# Patient Record
Sex: Male | Born: 1975
Health system: Southern US, Community
[De-identification: ages and names within clinical notes are randomized; demographics above are authoritative.]

## PROBLEM LIST (undated history)

## (undated) DIAGNOSIS — F4481 Dissociative identity disorder: Secondary | ICD-10-CM

## (undated) DIAGNOSIS — T4145XA Adverse effect of unspecified anesthetic, initial encounter: Secondary | ICD-10-CM

## (undated) DIAGNOSIS — T8859XA Other complications of anesthesia, initial encounter: Secondary | ICD-10-CM

## (undated) DIAGNOSIS — F4310 Post-traumatic stress disorder, unspecified: Secondary | ICD-10-CM

## (undated) DIAGNOSIS — E119 Type 2 diabetes mellitus without complications: Secondary | ICD-10-CM

## (undated) DIAGNOSIS — F6381 Intermittent explosive disorder: Secondary | ICD-10-CM

## (undated) DIAGNOSIS — I10 Essential (primary) hypertension: Secondary | ICD-10-CM

## (undated) DIAGNOSIS — F319 Bipolar disorder, unspecified: Secondary | ICD-10-CM

## (undated) DIAGNOSIS — G8929 Other chronic pain: Secondary | ICD-10-CM

## (undated) HISTORY — PX: FRACTURE SURGERY: SHX138

---
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
## 2009-11-19 ENCOUNTER — Emergency Department (HOSPITAL_COMMUNITY): Admission: EM | Admit: 2009-11-19 | Discharge: 2009-11-19 | Payer: Self-pay | Admitting: Emergency Medicine

## 2009-11-24 ENCOUNTER — Emergency Department (HOSPITAL_COMMUNITY): Admission: EM | Admit: 2009-11-24 | Discharge: 2009-11-24 | Payer: Self-pay | Admitting: Emergency Medicine

## 2010-04-30 ENCOUNTER — Emergency Department (HOSPITAL_COMMUNITY): Admission: EM | Admit: 2010-04-30 | Discharge: 2010-04-30 | Payer: Self-pay | Admitting: Emergency Medicine

## 2019-03-14 ENCOUNTER — Other Ambulatory Visit: Payer: Self-pay

## 2019-03-14 ENCOUNTER — Encounter (HOSPITAL_COMMUNITY): Payer: Self-pay | Admitting: Emergency Medicine

## 2019-03-14 ENCOUNTER — Emergency Department (HOSPITAL_COMMUNITY)
Admission: EM | Admit: 2019-03-14 | Discharge: 2019-03-14 | Discharge status: Against Medical Advice | Payer: Medicaid Other | Attending: Emergency Medicine | Admitting: Emergency Medicine

## 2019-03-14 DIAGNOSIS — M7918 Myalgia, other site: Secondary | ICD-10-CM | POA: Diagnosis present

## 2019-03-14 DIAGNOSIS — Z5321 Procedure and treatment not carried out due to patient leaving prior to being seen by health care provider: Secondary | ICD-10-CM | POA: Diagnosis not present

## 2019-03-14 HISTORY — DX: Essential (primary) hypertension: I10

## 2019-03-14 NOTE — ED Triage Notes (Signed)
Pt c/o generalized body pains for over a month. Denies injuries that could have caused this. Reports hurts to sit, lay down or to move.

## 2019-03-23 ENCOUNTER — Ambulatory Visit (HOSPITAL_COMMUNITY)
Admission: EM | Admit: 2019-03-23 | Discharge: 2019-03-23 | Disposition: A | Discharge status: Home / Self Care | Payer: Medicaid Other | Attending: Family Medicine | Admitting: Family Medicine

## 2019-03-23 ENCOUNTER — Encounter (HOSPITAL_COMMUNITY): Payer: Self-pay

## 2019-03-23 ENCOUNTER — Other Ambulatory Visit: Payer: Self-pay

## 2019-03-23 DIAGNOSIS — M5442 Lumbago with sciatica, left side: Secondary | ICD-10-CM

## 2019-03-23 DIAGNOSIS — M5441 Lumbago with sciatica, right side: Secondary | ICD-10-CM

## 2019-03-23 MED ORDER — CYCLOBENZAPRINE HCL 5 MG PO TABS: 5.0000 mg | ORAL_TABLET | Freq: Two times a day (BID) | ORAL | 0 refills | Status: DC | PRN

## 2019-03-23 MED ORDER — KETOROLAC TROMETHAMINE 30 MG/ML IJ SOLN
INTRAMUSCULAR | Status: AC
  Filled 2019-03-23: qty 1

## 2019-03-23 MED ORDER — PREDNISONE 50 MG PO TABS: 50.0000 mg | ORAL_TABLET | Freq: Every day | ORAL | 0 refills | Status: AC

## 2019-03-23 MED ORDER — KETOROLAC TROMETHAMINE 30 MG/ML IJ SOLN
30.0000 mg | Freq: Once | INTRAMUSCULAR | Status: AC
  Administered 2019-03-23: 30 mg via INTRAMUSCULAR

## 2019-03-23 NOTE — ED Provider Notes (Signed)
[place]    CSN: [id number] Arrival date & time: 03/23/19  1709      History   Chief Complaint Chief Complaint  Patient presents with   Back Pain   Leg Pain    HPI Mario Little is a 43 y.o. male history of obesity, hypertension presenting today for evaluation of lower back pain and groin pain.  Patient states that over the past 1 to 2 weeks he has had pain in his lower back that wraps around to his lower abdomen/groin.  He feels a knot that is located in his lower back.  States that typically when he has flares of his back pain he can rest for a few days and take Tylenol muscle relaxers and symptoms will resolve.  States that this pain is worse than normal and has been more persistent.  He states that he has difficulty moving short distances.  He has tried Tylenol, ibuprofen and Mobic without relief.  He states that he does have a history of sciatica.  He denies any numbness or tingling.  Denies saddle anesthesia.  Denies changes in urination or bowel movements.  He states that he has been delaying using the restroom due to avoiding triggering pain.  Staying in any position for prolonged periods of time triggers pain.  He denies any injury but does note he had an increase in activity by working on cars prior to this.  He relates multiple times that he has not experienced pain like this before.  States that day-to-day his pain waxes and wanes, but overall has been debilitating.  HPI  Past Medical History:  Diagnosis Date   Hypertension     There are no active problems to display for this patient.   Past Surgical History:  Procedure Laterality Date   FRACTURE SURGERY         Home Medications    Prior to Admission medications   Medication Sig Start Date End Date Taking? Authorizing Provider  cyclobenzaprine (FLEXERIL) 5 MG tablet Take 1-2 tablets (5-10 mg total) by mouth 2 (two) times daily as needed for muscle spasms. 03/23/19   Carlise Stofer C, PA-C    predniSONE (DELTASONE) 50 MG tablet Take 1 tablet (50 mg total) by mouth daily with breakfast for 5 days. 03/23/19 03/28/19  Orazio Weller, Elesa Hacker, PA-C    Family History Family History  Family history unknown: Yes    Social History Social History   Tobacco Use   Smoking status: Current Every Day Smoker    Types: Cigarettes   Smokeless tobacco: Never Used  Substance Use Topics   Alcohol use: Not on file   Drug use: Not on file     Allergies   Patient has no known allergies.   Review of Systems Review of Systems  Constitutional: Negative for fatigue and fever.  Eyes: Negative for redness, itching and visual disturbance.  Respiratory: Negative for shortness of breath.   Cardiovascular: Negative for chest pain and leg swelling.  Gastrointestinal: Positive for abdominal pain. Negative for nausea and vomiting.  Genitourinary: Negative for decreased urine volume and difficulty urinating.  Musculoskeletal: Positive for back pain and myalgias. Negative for arthralgias.  Skin: Negative for color change, rash and wound.  Neurological: Negative for dizziness, syncope, weakness, light-headedness and headaches.     Physical Exam Triage Vital Signs ED Triage Vitals  Enc Vitals Group     BP 03/23/19 1735 (!) 165/118     Pulse Rate 03/23/19 1735 86  Resp 03/23/19 1735 17     Temp 03/23/19 1735 98.2 F (36.8 C)     Temp Source 03/23/19 1735 Oral     SpO2 --      Weight --      Height --      Head Circumference --      Peak Flow --      Pain Score 03/23/19 1736 10     Pain Loc --      Pain Edu? --      Excl. in GC? --    No data found.  Updated Vital Signs BP (!) 165/118 (BP Location: Right Arm)    Pulse 86    Temp 98.2 F (36.8 C) (Oral)    Resp 17   Visual Acuity Right Eye Distance:   Left Eye Distance:   Bilateral Distance:    Right Eye Near:   Left Eye Near:    Bilateral Near:     Physical Exam Vitals signs and nursing note reviewed.  Constitutional:       Appearance: He is well-developed.     Comments: No acute distress Tall, obese  HENT:     Head: Normocephalic and atraumatic.     Nose: Nose normal.  Eyes:     Conjunctiva/sclera: Conjunctivae normal.  Neck:     Musculoskeletal: Neck supple.  Cardiovascular:     Rate and Rhythm: Normal rate.  Pulmonary:     Effort: Pulmonary effort is normal. No respiratory distress.  Abdominal:     General: There is no distension.     Comments: Nontender to palpation throughout abdomen and groin region  Musculoskeletal: Normal range of motion.     Comments: Tenderness to palpation diffusely throughout lumbar spine laterally and midline.  No focal tenderness, no palpable deformity or step-off midline  Patient has decreased strength, 4/5 in all directions at hips; seems to be avoiding triggering pain  Skin:    General: Skin is warm and dry.  Neurological:     Mental Status: He is alert and oriented to person, place, and time.      UC Treatments / Results  Labs (all labs ordered are listed, but only abnormal results are displayed) Labs Reviewed - No data to display  EKG   Radiology No results found.  Procedures Procedures (including critical care time)  Medications Ordered in UC Medications  ketorolac (TORADOL) 30 MG/ML injection 30 mg (30 mg Intramuscular Given 03/23/19 1826)  ketorolac (TORADOL) 30 MG/ML injection (has no administration in time range)    Initial Impression / Assessment and Plan / UC Course  I have reviewed the triage vital signs and the nursing notes.  Pertinent labs & imaging results that were available during my care of the patient were reviewed by me and considered in my medical decision making (see chart for details).     Patient with back pain x1 to 2 weeks, negative red flags for cauda equina.  Does have some decreased weakness when testing strength of lower extremities, but seems to be related to patient avoiding triggering pain rather than true  weakness.  Did discuss concerns regarding weakness in legs with patient.  Opted for trial of Toradol injection in clinic followed by steroid therapy given he has been failing use of NSAIDs.  As well as providing a muscle relaxer.  Advised if his symptoms do not resolve, worsen or develops increased weakness, issues with bowel or bladder control, numbness or tingling to follow-up in emergency room for further  imaging.  Follow-up with sports med for further outpatient imaging and management of back pain.Discussed strict return precautions. Patient verbalized understanding and is agreeable with plan.  Final Clinical Impressions(s) / UC Diagnoses   Final diagnoses:  Acute bilateral low back pain with bilateral sciatica     Discharge Instructions     We gave you a shot of Toradol today Continue with prednisone daily for 5 days, take with food and in the morning You may use flexeril as needed to help with pain. This is a muscle relaxer and causes sedation- please use only at bedtime or when you will be home and not have to drive/work  Please follow up with sports medicine for further evaluation of back Establish care with primary care- contact below  Follow up in emergency room if developing leg weakness, issues controlling urination/bowel movements, numbness in groin     ED Prescriptions    Medication Sig Dispense Auth. Provider   predniSONE (DELTASONE) 50 MG tablet Take 1 tablet (50 mg total) by mouth daily with breakfast for 5 days. 5 tablet Sahaana Weitman C, PA-C   cyclobenzaprine (FLEXERIL) 5 MG tablet Take 1-2 tablets (5-10 mg total) by mouth 2 (two) times daily as needed for muscle spasms. 24 tablet Kyrra Prada, [place] C, PA-C     Controlled Substance Prescriptions [place] Controlled Substance Registry consulted? Not Applicable   Lew DawesWieters, Baylen Buckner C, [place]PA-C 03/23/19 2001

## 2019-03-23 NOTE — Discharge Instructions (Addendum)
We gave you a shot of Toradol today Continue with prednisone daily for 5 days, take with food and in the morning You may use flexeril as needed to help with pain. This is a muscle relaxer and causes sedation- please use only at bedtime or when you will be home and not have to drive/work  Please follow up with sports medicine for further evaluation of back Establish care with primary care- contact below  Follow up in emergency room if developing leg weakness, issues controlling urination/bowel movements, numbness in groin

## 2019-03-23 NOTE — ED Triage Notes (Signed)
Pt presents with lower back pain that radiates down to hip and both legs X 7 days with no relief from OTC or prescribed pain medication.

## 2019-03-28 ENCOUNTER — Encounter: Payer: Self-pay | Admitting: Family Medicine

## 2019-03-28 ENCOUNTER — Ambulatory Visit (INDEPENDENT_AMBULATORY_CARE_PROVIDER_SITE_OTHER): Payer: Medicaid Other | Admitting: Family Medicine

## 2019-03-28 ENCOUNTER — Other Ambulatory Visit: Payer: Self-pay

## 2019-03-28 VITALS — BP 173/110 | Ht 73.0 in | Wt 350.0 lb

## 2019-03-28 DIAGNOSIS — M545 Low back pain, unspecified: Secondary | ICD-10-CM

## 2019-03-28 MED ORDER — HYDROCODONE-ACETAMINOPHEN 5-325 MG PO TABS: 1.0000 | ORAL_TABLET | Freq: Four times a day (QID) | ORAL | 0 refills | Status: DC | PRN

## 2019-03-28 MED ORDER — PREDNISONE 10 MG PO TABS: ORAL_TABLET | ORAL | 0 refills | Status: DC

## 2019-03-28 MED ORDER — BACLOFEN 10 MG PO TABS: 10.0000 mg | ORAL_TABLET | Freq: Three times a day (TID) | ORAL | 0 refills | Status: DC
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
## 2019-03-28 NOTE — Patient Instructions (Signed)
You likely have lumbar radiculopathy (a pinched nerve in your low back). Take tylenol for baseline pain relief (1-2 extra strength tabs 3x/day) A prednisone dose pack is the best option for immediate relief and was prescribed. Norco as needed for severe pain (no driving on this medicine). Baclofen as needed for muscle spasms (no driving on this medicine if it makes you sleepy). Stay as active as possible. Physical therapy has been shown to be helpful as well we have prescribed today Strengthening of low back muscles, abdominal musculature are key for long term pain relief. If not improving, will consider further imaging (MRI). Follow up with me in 1 month.

## 2019-04-05 ENCOUNTER — Ambulatory Visit: Payer: Medicaid Other | Attending: Family Medicine | Admitting: Physical Therapy

## 2019-04-05 ENCOUNTER — Encounter: Payer: Self-pay | Admitting: Physical Therapy

## 2019-04-05 ENCOUNTER — Other Ambulatory Visit: Payer: Self-pay

## 2019-04-05 DIAGNOSIS — M5442 Lumbago with sciatica, left side: Secondary | ICD-10-CM

## 2019-04-05 DIAGNOSIS — M5441 Lumbago with sciatica, right side: Secondary | ICD-10-CM | POA: Diagnosis present

## 2019-04-05 DIAGNOSIS — R262 Difficulty in walking, not elsewhere classified: Secondary | ICD-10-CM

## 2019-04-05 DIAGNOSIS — M25551 Pain in right hip: Secondary | ICD-10-CM | POA: Diagnosis present
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
## 2019-04-09 ENCOUNTER — Other Ambulatory Visit: Payer: Self-pay | Admitting: Family Medicine

## 2019-04-09 MED ORDER — HYDROCODONE-ACETAMINOPHEN 5-325 MG PO TABS: 1.0000 | ORAL_TABLET | Freq: Four times a day (QID) | ORAL | 0 refills | Status: DC | PRN

## 2019-04-09 MED ORDER — BACLOFEN 10 MG PO TABS: 10.0000 mg | ORAL_TABLET | Freq: Three times a day (TID) | ORAL | 1 refills | Status: DC

## 2019-04-09 NOTE — Addendum Note (Signed)
Addended by: Cyd Silence on: 04/09/2019 10:27 AM   Modules accepted: Orders

## 2019-04-09 NOTE — Progress Notes (Signed)
Patient will get radiographs then go ahead with MRI - see PT notes.  Will refill baclofen and hydrocodone.  Reviewed database.  No additional prednisone however.

## 2019-04-10 ENCOUNTER — Ambulatory Visit
Admission: RE | Admit: 2019-04-10 | Discharge: 2019-04-10 | Disposition: A | Discharge status: Home / Self Care | Payer: Medicaid Other | Source: Ambulatory Visit | Attending: Family Medicine | Admitting: Family Medicine

## 2019-04-10 ENCOUNTER — Telehealth: Payer: Self-pay

## 2019-04-10 DIAGNOSIS — M545 Low back pain, unspecified: Secondary | ICD-10-CM

## 2019-04-10 NOTE — Telephone Encounter (Signed)
Pt understands and agrees with plan

## 2019-04-27 ENCOUNTER — Other Ambulatory Visit: Payer: Self-pay

## 2019-04-27 ENCOUNTER — Ambulatory Visit
Admission: RE | Admit: 2019-04-27 | Discharge: 2019-04-27 | Disposition: A | Discharge status: Home / Self Care | Payer: Medicaid Other | Source: Ambulatory Visit | Attending: Family Medicine | Admitting: Family Medicine

## 2019-04-27 DIAGNOSIS — M545 Low back pain, unspecified: Secondary | ICD-10-CM

## 2019-04-30 ENCOUNTER — Other Ambulatory Visit: Payer: Self-pay

## 2019-04-30 ENCOUNTER — Other Ambulatory Visit: Payer: Self-pay | Admitting: Family Medicine

## 2019-04-30 ENCOUNTER — Encounter: Payer: Self-pay | Admitting: Family Medicine

## 2019-04-30 ENCOUNTER — Ambulatory Visit: Payer: Medicaid Other | Admitting: Family Medicine

## 2019-04-30 ENCOUNTER — Ambulatory Visit
Admission: RE | Admit: 2019-04-30 | Discharge: 2019-04-30 | Disposition: A | Discharge status: Home / Self Care | Payer: Medicaid Other | Source: Ambulatory Visit | Attending: Family Medicine | Admitting: Family Medicine

## 2019-04-30 VITALS — BP 147/95 | Ht 74.0 in | Wt 350.0 lb

## 2019-04-30 DIAGNOSIS — M5416 Radiculopathy, lumbar region: Secondary | ICD-10-CM

## 2019-04-30 DIAGNOSIS — M25552 Pain in left hip: Secondary | ICD-10-CM

## 2019-04-30 DIAGNOSIS — M546 Pain in thoracic spine: Secondary | ICD-10-CM

## 2019-04-30 DIAGNOSIS — M25551 Pain in right hip: Secondary | ICD-10-CM

## 2019-04-30 MED ORDER — IBUPROFEN 800 MG PO TABS: 800.0000 mg | ORAL_TABLET | Freq: Three times a day (TID) | ORAL | 1 refills | Status: DC | PRN

## 2019-04-30 MED ORDER — OXYCODONE-ACETAMINOPHEN 7.5-325 MG PO TABS: 1.0000 | ORAL_TABLET | ORAL | 0 refills | Status: DC | PRN

## 2019-04-30 MED ORDER — GABAPENTIN 300 MG PO CAPS: 300.0000 mg | ORAL_CAPSULE | Freq: Every day | ORAL | 2 refills | Status: DC

## 2019-04-30 NOTE — Addendum Note (Signed)
Addended by: Jolinda Croak E on: 04/30/2019 02:08 PM   Modules accepted: Orders
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
## 2019-04-30 NOTE — Patient Instructions (Addendum)
Get x-rays of your hips and your thoracic spine after you leave today. We will call you with those results and set up an injection for your back. Take gabapentin 300mg  at bedtime for nerve pain. Start Ibuprofen 800mg  three times a day for pain and inflammation. Percocet as needed for severe pain - no driving on this. Ok to use topical medications, salon pas patches in addition to these. Call me a week after the injection to let me know how you're doing - hopefully we can get the MRI of your thoracic spine approved also.

## 2019-05-07 ENCOUNTER — Ambulatory Visit
Admission: RE | Admit: 2019-05-07 | Discharge: 2019-05-07 | Disposition: A | Discharge status: Home / Self Care | Payer: Medicaid Other | Source: Ambulatory Visit | Attending: Family Medicine | Admitting: Family Medicine

## 2019-05-07 ENCOUNTER — Other Ambulatory Visit: Payer: Self-pay

## 2019-05-07 DIAGNOSIS — M5416 Radiculopathy, lumbar region: Secondary | ICD-10-CM

## 2019-05-07 MED ORDER — IOPAMIDOL (ISOVUE-M 200) INJECTION 41%
1.0000 mL | Freq: Once | INTRAMUSCULAR | Status: AC
  Administered 2019-05-07: 1 mL via EPIDURAL

## 2019-05-07 MED ORDER — METHYLPREDNISOLONE ACETATE 40 MG/ML INJ SUSP (RADIOLOG
120.0000 mg | Freq: Once | INTRAMUSCULAR | Status: AC
  Administered 2019-05-07: 120 mg via EPIDURAL

## 2019-05-14 ENCOUNTER — Telehealth: Payer: Self-pay | Admitting: Family Medicine

## 2019-05-14 MED ORDER — GABAPENTIN 300 MG PO CAPS: 300.0000 mg | ORAL_CAPSULE | Freq: Three times a day (TID) | ORAL | 1 refills | Status: DC

## 2019-05-14 MED ORDER — OXYCODONE-ACETAMINOPHEN 5-325 MG PO TABS: 1.0000 | ORAL_TABLET | Freq: Four times a day (QID) | ORAL | 0 refills | Status: DC | PRN

## 2019-05-14 NOTE — Telephone Encounter (Signed)
Would recommend going up on the gabapentin 300mg  - first to 1 cap/tab twice a day then to 1 cap/tab three times a day - sent in a script for higher amount.    Will give one more script for oxycodone 20 tablets but will need to use alternate methods for pain control after this.  I sent this in as well.

## 2019-05-14 NOTE — Telephone Encounter (Signed)
Sent to wrong pharmacy - staff called to cancel initial oxycodone and gabapentin scripts - sent to correct pharmacy.

## 2019-05-14 NOTE — Addendum Note (Signed)
Addended by: Dene Gentry on: 05/14/2019 04:45 PM   Modules accepted: Orders

## 2019-05-14 NOTE — Telephone Encounter (Signed)
-----   Message from [facility], LAT sent at 05/14/2019  2:00 PM EDT ----- Regarding: FW: refill request  ----- Message ----- From: Carolyne Littles Sent: 05/14/2019  11:49 AM EDT To: Jolinda Croak, LAT Subject: refill request                                 Pt is asking for a refill on pain medication. Back injections didn't help. Use this pharmacy [facility] [place]

## 2019-05-24 ENCOUNTER — Ambulatory Visit
Admission: RE | Admit: 2019-05-24 | Discharge: 2019-05-24 | Disposition: A | Discharge status: Home / Self Care | Payer: Medicaid Other | Source: Ambulatory Visit | Attending: Family Medicine | Admitting: Family Medicine

## 2019-05-24 ENCOUNTER — Other Ambulatory Visit: Payer: Self-pay

## 2019-05-24 DIAGNOSIS — M546 Pain in thoracic spine: Secondary | ICD-10-CM

## 2019-06-01 ENCOUNTER — Other Ambulatory Visit: Payer: Self-pay | Admitting: Family Medicine

## 2019-06-01 MED ORDER — GABAPENTIN 300 MG PO CAPS: 600.0000 mg | ORAL_CAPSULE | Freq: Three times a day (TID) | ORAL | 2 refills | Status: AC

## 2019-06-01 MED ORDER — TIZANIDINE HCL 4 MG PO TABS: 4.0000 mg | ORAL_TABLET | Freq: Three times a day (TID) | ORAL | 1 refills | Status: AC | PRN

## 2019-06-01 MED ORDER — OXYCODONE-ACETAMINOPHEN 5-325 MG PO TABS: 1.0000 | ORAL_TABLET | Freq: Four times a day (QID) | ORAL | 0 refills | Status: DC | PRN

## 2019-06-01 NOTE — Progress Notes (Unsigned)
MRI of thoracic spine reviewed and discussed with patient.  He unfortunately has arthritis throughout thoracic spine.  He's trying to establish with [facility].  He continues to struggle with quite a bit of pain.  I'm not certain neurosurgery will have much to offer him and I prepared him regarding this but will refer to them for their input.  Increase gabapentin to 600mg  three times a day.  Zanaflex as needed for spasms.  Oxycodone as needed for severe pain.

## 2019-06-08 ENCOUNTER — Encounter (HOSPITAL_COMMUNITY): Payer: Self-pay

## 2019-06-08 ENCOUNTER — Ambulatory Visit (HOSPITAL_COMMUNITY)
Admission: EM | Admit: 2019-06-08 | Discharge: 2019-06-08 | Disposition: A | Discharge status: Home / Self Care | Payer: Medicaid Other | Attending: Urgent Care | Admitting: Urgent Care

## 2019-06-08 ENCOUNTER — Other Ambulatory Visit: Payer: Self-pay

## 2019-06-08 DIAGNOSIS — L02214 Cutaneous abscess of groin: Secondary | ICD-10-CM

## 2019-06-08 DIAGNOSIS — L739 Follicular disorder, unspecified: Secondary | ICD-10-CM

## 2019-06-08 DIAGNOSIS — R1032 Left lower quadrant pain: Secondary | ICD-10-CM | POA: Diagnosis not present

## 2019-06-08 DIAGNOSIS — M545 Low back pain: Secondary | ICD-10-CM

## 2019-06-08 MED ORDER — DOXYCYCLINE HYCLATE 100 MG PO CAPS: 100.0000 mg | ORAL_CAPSULE | Freq: Two times a day (BID) | ORAL | 0 refills | Status: DC

## 2019-06-08 MED ORDER — LIDOCAINE-EPINEPHRINE (PF) 2 %-1:200000 IJ SOLN
INTRAMUSCULAR | Status: AC
  Filled 2019-06-08: qty 20

## 2019-06-08 NOTE — ED Provider Notes (Signed)
MRN: [id number] DOB: 1975/11/23  Subjective:   Mario Little is a 43 y.o. male presenting for 3-day history of acute onset worsening left-sided groin pain, redness and swelling.  Patient has a history of abscesses in has tried to pop some on his own but has gotten much worse.  He was unable to come into the clinic due to severe back pain which she is working with his neurosurgeon for this.  Denies fever, chest pain, nausea, vomiting, belly pain, dysuria, hematuria.  No current facility-administered medications for this encounter.   Current Outpatient Medications:  .  baclofen (LIORESAL) 10 MG tablet, Take 1 tablet (10 mg total) by mouth 3 (three) times daily. (Patient not taking: Reported on 04/30/2019), Disp: 60 each, Rfl: 1 .  cyclobenzaprine (FLEXERIL) 5 MG tablet, Take 1-2 tablets (5-10 mg total) by mouth 2 (two) times daily as needed for muscle spasms. (Patient not taking: Reported on 04/30/2019), Disp: 24 tablet, Rfl: 0 .  gabapentin (NEURONTIN) 300 MG capsule, Take 2 capsules (600 mg total) by mouth 3 (three) times daily., Disp: 180 capsule, Rfl: 2 .  ibuprofen (ADVIL) 800 MG tablet, Take 1 tablet (800 mg total) by mouth every 8 (eight) hours as needed., Disp: 90 tablet, Rfl: 1 .  oxyCODONE-acetaminophen (PERCOCET/ROXICET) 5-325 MG tablet, Take 1 tablet by mouth every 6 (six) hours as needed for severe pain., Disp: 20 tablet, Rfl: 0 .  predniSONE (DELTASONE) 10 MG tablet, Take as directed per MD instructions (Patient not taking: Reported on 04/30/2019), Disp: 48 tablet, Rfl: 0 .  tiZANidine (ZANAFLEX) 4 MG tablet, Take 1 tablet (4 mg total) by mouth every 8 (eight) hours as needed., Disp: 90 tablet, Rfl: 1    No Known Allergies   Past Medical History:  Diagnosis Date  . Hypertension      Past Surgical History:  Procedure Laterality Date  . FRACTURE SURGERY      ROS  Objective:   Vitals: BP (!) 146/82 (BP Location: Left Arm)   Pulse (!) 107   Temp 98.8 F (37.1 C) (Oral)    Resp 18   SpO2 100%   Physical Exam Constitutional:      General: He is in acute distress (from his low back pain and infection).     Appearance: Normal appearance. He is well-developed. He is obese. He is ill-appearing. He is not toxic-appearing or diaphoretic.  HENT:     Head: Normocephalic and atraumatic.     Right Ear: External ear normal.     Left Ear: External ear normal.     Nose: Nose normal.     Mouth/Throat:     Mouth: Mucous membranes are moist.     Pharynx: Oropharynx is clear.  Eyes:     General: No scleral icterus.    Extraocular Movements: Extraocular movements intact.     Pupils: Pupils are equal, round, and reactive to light.  Cardiovascular:     Rate and Rhythm: Regular rhythm. Tachycardia present.     Heart sounds: Normal heart sounds. No murmur. No friction rub. No gallop.      Comments: Borderline tachycardia. Rechecked and ranged from 98-110. Pulmonary:     Effort: Pulmonary effort is normal. No respiratory distress.     Breath sounds: Normal breath sounds. No stridor. No wheezing, rhonchi or rales.  Genitourinary:   Neurological:     Mental Status: He is alert and oriented to person, place, and time.     Coordination: Coordination abnormal (favoring low back, limited ambulation  due to his back).  Psychiatric:        Mood and Affect: Mood normal.        Behavior: Behavior normal.        Thought Content: Thought content normal.     PROCEDURE NOTE: I&D of Abscess Verbal consent obtained. Local anesthesia with 1.5cc of 2% lidocaine with epinephrine. Site cleansed with alcohol swabs. Incision of 1cm was made using a 11 blade, discharge of copious amounts of pus and serosanguinous fluid.  Cleansed and dressed.   Assessment and Plan :   1. Folliculitis   2. Abscess of left groin   3. Groin pain, left     I&D performed successfully, will have patient start doxycycline.  Counseled patient on my fear that his abscess may be tracking as he has a large  area of erythema with multiple areas of fluctuance.  We will have patient maintain close follow-up and strict ER precautions. Counseled patient on potential for adverse effects with medications prescribed/recommended today, ER and return-to-clinic precautions discussed, patient verbalized understanding.  Maintain close follow-up with back surgeon.    Wallis Bamberg, [place] 06/08/19 1542

## 2019-06-08 NOTE — Discharge Instructions (Addendum)
Please change your dressing twice daily.  If you develop fevers, nausea, vomiting, abdominal pain, worsening redness and pain about the groin area then come back for recheck.

## 2019-06-08 NOTE — ED Triage Notes (Signed)
Pt states having an abscess in his left sided groin pain x 3 days. Pt states is painful.   Pt reports if the abscess needs to be pop, is need to be carefully, because his being treating for back pain and any strong movement will affect his back.

## 2019-06-10 ENCOUNTER — Emergency Department (HOSPITAL_COMMUNITY): Payer: Medicaid Other

## 2019-06-10 ENCOUNTER — Inpatient Hospital Stay (HOSPITAL_COMMUNITY)
Admission: EM | Admit: 2019-06-10 | Discharge: 2019-06-18 | DRG: 571 | Disposition: A | Discharge status: Home Health | Payer: Medicaid Other | Attending: Family Medicine | Admitting: Family Medicine

## 2019-06-10 ENCOUNTER — Other Ambulatory Visit: Payer: Self-pay

## 2019-06-10 ENCOUNTER — Encounter (HOSPITAL_COMMUNITY): Payer: Self-pay

## 2019-06-10 ENCOUNTER — Encounter (HOSPITAL_COMMUNITY)
Admission: EM | Disposition: A | Discharge status: Home Health | Payer: Self-pay | Source: Home / Self Care | Attending: Family Medicine

## 2019-06-10 ENCOUNTER — Inpatient Hospital Stay (HOSPITAL_COMMUNITY): Payer: Medicaid Other | Admitting: Anesthesiology

## 2019-06-10 DIAGNOSIS — Z79899 Other long term (current) drug therapy: Secondary | ICD-10-CM

## 2019-06-10 DIAGNOSIS — F319 Bipolar disorder, unspecified: Secondary | ICD-10-CM | POA: Diagnosis present

## 2019-06-10 DIAGNOSIS — B951 Streptococcus, group B, as the cause of diseases classified elsewhere: Secondary | ICD-10-CM | POA: Diagnosis present

## 2019-06-10 DIAGNOSIS — Z791 Long term (current) use of non-steroidal anti-inflammatories (NSAID): Secondary | ICD-10-CM

## 2019-06-10 DIAGNOSIS — F4310 Post-traumatic stress disorder, unspecified: Secondary | ICD-10-CM | POA: Diagnosis present

## 2019-06-10 DIAGNOSIS — N4889 Other specified disorders of penis: Secondary | ICD-10-CM | POA: Diagnosis present

## 2019-06-10 DIAGNOSIS — R739 Hyperglycemia, unspecified: Secondary | ICD-10-CM | POA: Diagnosis present

## 2019-06-10 DIAGNOSIS — E11628 Type 2 diabetes mellitus with other skin complications: Secondary | ICD-10-CM | POA: Diagnosis not present

## 2019-06-10 DIAGNOSIS — L0291 Cutaneous abscess, unspecified: Secondary | ICD-10-CM | POA: Diagnosis not present

## 2019-06-10 DIAGNOSIS — N493 Fournier gangrene: Secondary | ICD-10-CM

## 2019-06-10 DIAGNOSIS — E1165 Type 2 diabetes mellitus with hyperglycemia: Secondary | ICD-10-CM | POA: Diagnosis present

## 2019-06-10 DIAGNOSIS — Z79891 Long term (current) use of opiate analgesic: Secondary | ICD-10-CM

## 2019-06-10 DIAGNOSIS — Z6841 Body Mass Index (BMI) 40.0 and over, adult: Secondary | ICD-10-CM | POA: Diagnosis not present

## 2019-06-10 DIAGNOSIS — Z833 Family history of diabetes mellitus: Secondary | ICD-10-CM

## 2019-06-10 DIAGNOSIS — F17210 Nicotine dependence, cigarettes, uncomplicated: Secondary | ICD-10-CM | POA: Diagnosis present

## 2019-06-10 DIAGNOSIS — G8929 Other chronic pain: Secondary | ICD-10-CM | POA: Diagnosis not present

## 2019-06-10 DIAGNOSIS — E876 Hypokalemia: Secondary | ICD-10-CM | POA: Diagnosis not present

## 2019-06-10 DIAGNOSIS — L02214 Cutaneous abscess of groin: Secondary | ICD-10-CM | POA: Diagnosis present

## 2019-06-10 DIAGNOSIS — I10 Essential (primary) hypertension: Secondary | ICD-10-CM | POA: Diagnosis present

## 2019-06-10 DIAGNOSIS — F6381 Intermittent explosive disorder: Secondary | ICD-10-CM | POA: Diagnosis present

## 2019-06-10 DIAGNOSIS — F419 Anxiety disorder, unspecified: Secondary | ICD-10-CM | POA: Diagnosis present

## 2019-06-10 DIAGNOSIS — F4481 Dissociative identity disorder: Secondary | ICD-10-CM | POA: Diagnosis present

## 2019-06-10 DIAGNOSIS — E1140 Type 2 diabetes mellitus with diabetic neuropathy, unspecified: Secondary | ICD-10-CM | POA: Diagnosis present

## 2019-06-10 DIAGNOSIS — M549 Dorsalgia, unspecified: Secondary | ICD-10-CM | POA: Diagnosis not present

## 2019-06-10 DIAGNOSIS — Z20828 Contact with and (suspected) exposure to other viral communicable diseases: Secondary | ICD-10-CM | POA: Diagnosis not present

## 2019-06-10 DIAGNOSIS — R21 Rash and other nonspecific skin eruption: Secondary | ICD-10-CM | POA: Diagnosis present

## 2019-06-10 HISTORY — DX: Other complications of anesthesia, initial encounter: T88.59XA

## 2019-06-10 HISTORY — DX: Type 2 diabetes mellitus without complications: E11.9

## 2019-06-10 HISTORY — PX: INCISION AND DRAINAGE ABSCESS: SHX5864

## 2019-06-10 HISTORY — DX: Other chronic pain: G89.29

## 2019-06-10 HISTORY — DX: Intermittent explosive disorder: F63.81

## 2019-06-10 HISTORY — DX: Bipolar disorder, unspecified: F31.9

## 2019-06-10 HISTORY — DX: Dissociative identity disorder: F44.81

## 2019-06-10 HISTORY — DX: Post-traumatic stress disorder, unspecified: F43.10

## 2019-06-10 LAB — URINALYSIS, ROUTINE W REFLEX MICROSCOPIC
Bacteria, UA: NONE SEEN
Bilirubin Urine: NEGATIVE
Glucose, UA: >=500 mg/dL — AB
Ketones, ur: 80 mg/dL — AB
Leukocytes,Ua: NEGATIVE
Nitrite: NEGATIVE
Protein, ur: 30 mg/dL — AB
Specific Gravity, Urine: 1.031 — ABNORMAL HIGH (ref 1.005–1.030)
pH: 5 (ref 5.0–8.0)

## 2019-06-10 LAB — BASIC METABOLIC PANEL
Anion gap: 16 — ABNORMAL HIGH (ref 5–15)
BUN: 6 mg/dL (ref 6–20)
CO2: 16 mmol/L — ABNORMAL LOW (ref 22–32)
Calcium: 8.4 mg/dL — ABNORMAL LOW (ref 8.9–10.3)
Chloride: 104 mmol/L (ref 98–111)
Creatinine, Ser: 0.91 mg/dL (ref 0.61–1.24)
GFR calc Af Amer: >60 mL/min (ref >60)
GFR calc non Af Amer: >60 mL/min (ref >60)
Glucose, Bld: 294 mg/dL — ABNORMAL HIGH (ref 70–99)
Potassium: 3.2 mmol/L — ABNORMAL LOW (ref 3.5–5.1)
Sodium: 136 mmol/L (ref 135–145)

## 2019-06-10 LAB — CBC WITH DIFFERENTIAL/PLATELET
Abs Immature Granulocytes: 0.58 10*3/uL — ABNORMAL HIGH (ref 0.00–0.07)
Basophils Absolute: 0.1 10*3/uL (ref 0.0–0.1)
Basophils Relative: 1 %
Eosinophils Absolute: 0.3 10*3/uL (ref 0.0–0.5)
Eosinophils Relative: 2 %
HCT: 39.9 % (ref 39.0–52.0)
Hemoglobin: 12.8 g/dL — ABNORMAL LOW (ref 13.0–17.0)
Immature Granulocytes: 4 %
Lymphocytes Relative: 11 %
Lymphs Abs: 1.5 10*3/uL (ref 0.7–4.0)
MCH: 26.2 pg (ref 26.0–34.0)
MCHC: 32.1 g/dL (ref 30.0–36.0)
MCV: 81.8 fL (ref 80.0–100.0)
Monocytes Absolute: 1.1 10*3/uL — ABNORMAL HIGH (ref 0.1–1.0)
Monocytes Relative: 8 %
Neutro Abs: 10.4 10*3/uL — ABNORMAL HIGH (ref 1.7–7.7)
Neutrophils Relative %: 74 %
Platelets: 257 10*3/uL (ref 150–400)
RBC: 4.88 MIL/uL (ref 4.22–5.81)
RDW: 14.3 % (ref 11.5–15.5)
WBC: 14 10*3/uL — ABNORMAL HIGH (ref 4.0–10.5)
nRBC: 0 % (ref 0.0–0.2)

## 2019-06-10 LAB — POCT I-STAT EG7
Acid-base deficit: 5 mmol/L — ABNORMAL HIGH (ref 0.0–2.0)
Bicarbonate: 18.8 mmol/L — ABNORMAL LOW (ref 20.0–28.0)
Calcium, Ion: 1.12 mmol/L — ABNORMAL LOW (ref 1.15–1.40)
HCT: 38 % — ABNORMAL LOW (ref 39.0–52.0)
Hemoglobin: 12.9 g/dL — ABNORMAL LOW (ref 13.0–17.0)
O2 Saturation: 99 %
Potassium: 5.5 mmol/L — ABNORMAL HIGH (ref 3.5–5.1)
Sodium: 134 mmol/L — ABNORMAL LOW (ref 135–145)
TCO2: 20 mmol/L — ABNORMAL LOW (ref 22–32)
pCO2, Ven: 31.2 mmHg — ABNORMAL LOW (ref 44.0–60.0)
pH, Ven: 7.388 (ref 7.250–7.430)
pO2, Ven: 119 mmHg — ABNORMAL HIGH (ref 32.0–45.0)

## 2019-06-10 LAB — COMPREHENSIVE METABOLIC PANEL
ALT: 18 U/L (ref 0–44)
AST: 14 U/L — ABNORMAL LOW (ref 15–41)
Albumin: 2.6 g/dL — ABNORMAL LOW (ref 3.5–5.0)
Alkaline Phosphatase: 114 U/L (ref 38–126)
Anion gap: 19 — ABNORMAL HIGH (ref 5–15)
BUN: 5 mg/dL — ABNORMAL LOW (ref 6–20)
CO2: 16 mmol/L — ABNORMAL LOW (ref 22–32)
Calcium: 9 mg/dL (ref 8.9–10.3)
Chloride: 100 mmol/L (ref 98–111)
Creatinine, Ser: 1.1 mg/dL (ref 0.61–1.24)
GFR calc Af Amer: >60 mL/min (ref >60)
GFR calc non Af Amer: >60 mL/min (ref >60)
Glucose, Bld: 353 mg/dL — ABNORMAL HIGH (ref 70–99)
Potassium: 3.3 mmol/L — ABNORMAL LOW (ref 3.5–5.1)
Sodium: 135 mmol/L (ref 135–145)
Total Bilirubin: 1.3 mg/dL — ABNORMAL HIGH (ref 0.3–1.2)
Total Protein: 6.7 g/dL (ref 6.5–8.1)

## 2019-06-10 LAB — I-STAT CHEM 8, ED
BUN: 8 mg/dL (ref 6–20)
Calcium, Ion: 1.09 mmol/L — ABNORMAL LOW (ref 1.15–1.40)
Chloride: 104 mmol/L (ref 98–111)
Creatinine, Ser: 0.7 mg/dL (ref 0.61–1.24)
Glucose, Bld: 336 mg/dL — ABNORMAL HIGH (ref 70–99)
HCT: 38 % — ABNORMAL LOW (ref 39.0–52.0)
Hemoglobin: 12.9 g/dL — ABNORMAL LOW (ref 13.0–17.0)
Potassium: 5.5 mmol/L — ABNORMAL HIGH (ref 3.5–5.1)
Sodium: 134 mmol/L — ABNORMAL LOW (ref 135–145)
TCO2: 19 mmol/L — ABNORMAL LOW (ref 22–32)

## 2019-06-10 LAB — SARS CORONAVIRUS 2 BY RT PCR (HOSPITAL ORDER, PERFORMED IN ~~LOC~~ HOSPITAL LAB): SARS Coronavirus 2: NEGATIVE

## 2019-06-10 LAB — GLUCOSE, CAPILLARY
Glucose-Capillary: 217 mg/dL — ABNORMAL HIGH (ref 70–99)
Glucose-Capillary: 218 mg/dL — ABNORMAL HIGH (ref 70–99)
Glucose-Capillary: 245 mg/dL — ABNORMAL HIGH (ref 70–99)

## 2019-06-10 LAB — LACTIC ACID, PLASMA: Lactic Acid, Venous: 1.6 mmol/L (ref 0.5–1.9)

## 2019-06-10 LAB — HIV ANTIBODY (ROUTINE TESTING W REFLEX): HIV Screen 4th Generation wRfx: NONREACTIVE

## 2019-06-10 SURGERY — INCISION AND DRAINAGE, ABSCESS
Anesthesia: General

## 2019-06-10 MED ORDER — FENTANYL CITRATE (PF) 100 MCG/2ML IJ SOLN
INTRAMUSCULAR | Status: AC
  Filled 2019-06-10: qty 2

## 2019-06-10 MED ORDER — OXYCODONE HCL 5 MG PO TABS: 5.0000 mg | ORAL_TABLET | Freq: Once | ORAL | Status: DC | PRN

## 2019-06-10 MED ORDER — TIZANIDINE HCL 2 MG PO TABS
4.0000 mg | ORAL_TABLET | Freq: Three times a day (TID) | ORAL | Status: DC | PRN
  Administered 2019-06-10 – 2019-06-17 (×13): 4 mg via ORAL
  Filled 2019-06-10: qty 2
  Filled 2019-06-10 (×2): qty 1
  Filled 2019-06-10 (×5): qty 2
  Filled 2019-06-10: qty 1
  Filled 2019-06-10 (×6): qty 2
  Filled 2019-06-10: qty 1

## 2019-06-10 MED ORDER — VANCOMYCIN HCL 10 G IV SOLR
2000.0000 mg | Freq: Once | INTRAVENOUS | Status: AC
  Administered 2019-06-10: 2000 mg via INTRAVENOUS
  Filled 2019-06-10: qty 2000

## 2019-06-10 MED ORDER — VANCOMYCIN HCL IN DEXTROSE 1-5 GM/200ML-% IV SOLN: 1000.0000 mg | Freq: Once | INTRAVENOUS | Status: DC

## 2019-06-10 MED ORDER — POLYETHYLENE GLYCOL 3350 17 G PO PACK
17.0000 g | PACK | Freq: Every day | ORAL | Status: DC
  Administered 2019-06-10: 17 g via ORAL
  Filled 2019-06-10 (×4): qty 1

## 2019-06-10 MED ORDER — FENTANYL CITRATE (PF) 100 MCG/2ML IJ SOLN
25.0000 ug | INTRAMUSCULAR | Status: DC | PRN
  Administered 2019-06-10 (×2): 50 ug via INTRAVENOUS

## 2019-06-10 MED ORDER — IOHEXOL 300 MG/ML  SOLN
125.0000 mL | Freq: Once | INTRAMUSCULAR | Status: AC | PRN
  Administered 2019-06-10: 100 mL via INTRAVENOUS

## 2019-06-10 MED ORDER — SUGAMMADEX SODIUM 500 MG/5ML IV SOLN
INTRAVENOUS | Status: AC
  Filled 2019-06-10: qty 5

## 2019-06-10 MED ORDER — ONDANSETRON HCL 4 MG/2ML IJ SOLN
INTRAMUSCULAR | Status: DC | PRN
  Administered 2019-06-10: 4 mg via INTRAVENOUS

## 2019-06-10 MED ORDER — DEXAMETHASONE SODIUM PHOSPHATE 10 MG/ML IJ SOLN
INTRAMUSCULAR | Status: AC
  Filled 2019-06-10: qty 1

## 2019-06-10 MED ORDER — VANCOMYCIN HCL 10 G IV SOLR
1500.0000 mg | Freq: Two times a day (BID) | INTRAVENOUS | Status: DC
  Administered 2019-06-10 – 2019-06-12 (×5): 1500 mg via INTRAVENOUS
  Filled 2019-06-10 (×7): qty 1500

## 2019-06-10 MED ORDER — LACTATED RINGERS IV BOLUS
1500.0000 mL | Freq: Once | INTRAVENOUS | Status: AC
  Administered 2019-06-10: 09:00:00 1500 mL via INTRAVENOUS

## 2019-06-10 MED ORDER — HYDROMORPHONE HCL 1 MG/ML IJ SOLN
1.0000 mg | Freq: Once | INTRAMUSCULAR | Status: AC
  Administered 2019-06-10: 1 mg via INTRAVENOUS
  Filled 2019-06-10: qty 1

## 2019-06-10 MED ORDER — PROPOFOL 10 MG/ML IV BOLUS
INTRAVENOUS | Status: DC | PRN
  Administered 2019-06-10: 150 mg via INTRAVENOUS

## 2019-06-10 MED ORDER — PIPERACILLIN-TAZOBACTAM 3.375 G IVPB 30 MIN
3.3750 g | Freq: Once | INTRAVENOUS | Status: AC
  Administered 2019-06-10: 3.375 g via INTRAVENOUS
  Filled 2019-06-10: qty 50

## 2019-06-10 MED ORDER — FENTANYL CITRATE (PF) 250 MCG/5ML IJ SOLN
INTRAMUSCULAR | Status: AC
  Filled 2019-06-10: qty 5

## 2019-06-10 MED ORDER — CLINDAMYCIN PHOSPHATE 900 MG/50ML IV SOLN
900.0000 mg | Freq: Once | INTRAVENOUS | Status: AC
  Administered 2019-06-10: 900 mg via INTRAVENOUS
  Filled 2019-06-10: qty 50

## 2019-06-10 MED ORDER — SODIUM CHLORIDE 0.9 % IV SOLN
2.0000 g | Freq: Once | INTRAVENOUS | Status: AC
  Administered 2019-06-10: 2 g via INTRAVENOUS
  Filled 2019-06-10: qty 20

## 2019-06-10 MED ORDER — GABAPENTIN 300 MG PO CAPS
600.0000 mg | ORAL_CAPSULE | Freq: Three times a day (TID) | ORAL | Status: DC
  Administered 2019-06-10 – 2019-06-18 (×23): 600 mg via ORAL
  Filled 2019-06-10 (×23): qty 2

## 2019-06-10 MED ORDER — INSULIN ASPART 100 UNIT/ML ~~LOC~~ SOLN
6.0000 [IU] | Freq: Once | SUBCUTANEOUS | Status: AC
  Administered 2019-06-10: 6 [IU] via SUBCUTANEOUS

## 2019-06-10 MED ORDER — INSULIN ASPART 100 UNIT/ML ~~LOC~~ SOLN
0.0000 [IU] | Freq: Three times a day (TID) | SUBCUTANEOUS | Status: DC
  Administered 2019-06-10 – 2019-06-11 (×2): 3 [IU] via SUBCUTANEOUS
  Administered 2019-06-11: 08:00:00 5 [IU] via SUBCUTANEOUS
  Administered 2019-06-11: 7 [IU] via SUBCUTANEOUS
  Administered 2019-06-12 (×2): 2 [IU] via SUBCUTANEOUS
  Administered 2019-06-12: 3 [IU] via SUBCUTANEOUS

## 2019-06-10 MED ORDER — OXYCODONE HCL 5 MG/5ML PO SOLN: 5.0000 mg | Freq: Once | ORAL | Status: DC | PRN

## 2019-06-10 MED ORDER — ACETAMINOPHEN 10 MG/ML IV SOLN: 1000.0000 mg | Freq: Once | INTRAVENOUS | Status: DC | PRN

## 2019-06-10 MED ORDER — LACTATED RINGERS IV BOLUS
1000.0000 mL | Freq: Once | INTRAVENOUS | Status: AC
  Administered 2019-06-10: 10:00:00 1000 mL via INTRAVENOUS

## 2019-06-10 MED ORDER — PROPOFOL 10 MG/ML IV BOLUS
INTRAVENOUS | Status: AC
  Filled 2019-06-10: qty 20

## 2019-06-10 MED ORDER — OXYCODONE-ACETAMINOPHEN 5-325 MG PO TABS
1.0000 | ORAL_TABLET | ORAL | Status: DC | PRN
  Administered 2019-06-10: 1 via ORAL
  Filled 2019-06-10: qty 1

## 2019-06-10 MED ORDER — MIDAZOLAM HCL 5 MG/5ML IJ SOLN
INTRAMUSCULAR | Status: DC | PRN
  Administered 2019-06-10: 2 mg via INTRAVENOUS

## 2019-06-10 MED ORDER — ACETAMINOPHEN 160 MG/5ML PO SOLN: 1000.0000 mg | Freq: Once | ORAL | Status: DC | PRN

## 2019-06-10 MED ORDER — FENTANYL CITRATE (PF) 100 MCG/2ML IJ SOLN
INTRAMUSCULAR | Status: DC | PRN
  Administered 2019-06-10: 50 ug via INTRAVENOUS
  Administered 2019-06-10: 150 ug via INTRAVENOUS

## 2019-06-10 MED ORDER — BUPIVACAINE-EPINEPHRINE 0.5% -1:200000 IJ SOLN
INTRAMUSCULAR | Status: AC
  Filled 2019-06-10: qty 1

## 2019-06-10 MED ORDER — INSULIN GLARGINE 100 UNIT/ML ~~LOC~~ SOLN
15.0000 [IU] | Freq: Every day | SUBCUTANEOUS | Status: DC
  Filled 2019-06-10 (×3): qty 0.15

## 2019-06-10 MED ORDER — INSULIN ASPART 100 UNIT/ML ~~LOC~~ SOLN
0.0000 [IU] | Freq: Every day | SUBCUTANEOUS | Status: DC
  Administered 2019-06-10 – 2019-06-11 (×2): 2 [IU] via SUBCUTANEOUS
  Administered 2019-06-12: 22:00:00 3 [IU] via SUBCUTANEOUS
  Administered 2019-06-13 – 2019-06-14 (×2): 2 [IU] via SUBCUTANEOUS

## 2019-06-10 MED ORDER — MORPHINE SULFATE (PF) 4 MG/ML IV SOLN
4.0000 mg | INTRAVENOUS | Status: DC | PRN
  Administered 2019-06-10 – 2019-06-11 (×3): 4 mg via INTRAVENOUS
  Filled 2019-06-10 (×4): qty 1

## 2019-06-10 MED ORDER — ONDANSETRON HCL 4 MG/2ML IJ SOLN
INTRAMUSCULAR | Status: AC
  Filled 2019-06-10: qty 4

## 2019-06-10 MED ORDER — ONDANSETRON HCL 4 MG/2ML IJ SOLN: 4.0000 mg | Freq: Four times a day (QID) | INTRAMUSCULAR | Status: DC | PRN

## 2019-06-10 MED ORDER — ROCURONIUM BROMIDE 50 MG/5ML IV SOSY
PREFILLED_SYRINGE | INTRAVENOUS | Status: DC | PRN
  Administered 2019-06-10: 100 mg via INTRAVENOUS

## 2019-06-10 MED ORDER — HYDROMORPHONE HCL 1 MG/ML IJ SOLN: 1.0000 mg | Freq: Once | INTRAMUSCULAR | Status: DC

## 2019-06-10 MED ORDER — SUGAMMADEX SODIUM 500 MG/5ML IV SOLN
INTRAVENOUS | Status: DC | PRN
  Administered 2019-06-10: 300 mg via INTRAVENOUS

## 2019-06-10 MED ORDER — POTASSIUM CHLORIDE CRYS ER 20 MEQ PO TBCR
40.0000 meq | EXTENDED_RELEASE_TABLET | Freq: Once | ORAL | Status: AC
  Administered 2019-06-10: 20:00:00 40 meq via ORAL
  Filled 2019-06-10: qty 2

## 2019-06-10 MED ORDER — MIDAZOLAM HCL 2 MG/2ML IJ SOLN
INTRAMUSCULAR | Status: AC
  Filled 2019-06-10: qty 2

## 2019-06-10 MED ORDER — LACTATED RINGERS IV SOLN
INTRAVENOUS | Status: DC
  Administered 2019-06-10: 13:00:00 via INTRAVENOUS

## 2019-06-10 MED ORDER — DEXAMETHASONE SODIUM PHOSPHATE 10 MG/ML IJ SOLN
INTRAMUSCULAR | Status: DC | PRN
  Administered 2019-06-10: 5 mg via INTRAVENOUS

## 2019-06-10 MED ORDER — INSULIN GLARGINE 100 UNIT/ML ~~LOC~~ SOLN
29.0000 [IU] | Freq: Every day | SUBCUTANEOUS | Status: DC
  Filled 2019-06-10: qty 0.29

## 2019-06-10 MED ORDER — PIPERACILLIN-TAZOBACTAM 3.375 G IVPB
3.3750 g | Freq: Three times a day (TID) | INTRAVENOUS | Status: DC
  Administered 2019-06-10 – 2019-06-14 (×12): 3.375 g via INTRAVENOUS
  Filled 2019-06-10 (×11): qty 50

## 2019-06-10 MED ORDER — LIDOCAINE HCL (CARDIAC) PF 100 MG/5ML IV SOSY
PREFILLED_SYRINGE | INTRAVENOUS | Status: DC | PRN
  Administered 2019-06-10: 80 mg via INTRAVENOUS

## 2019-06-10 MED ORDER — ACETAMINOPHEN 500 MG PO TABS: 1000.0000 mg | ORAL_TABLET | Freq: Once | ORAL | Status: DC | PRN

## 2019-06-10 MED ORDER — 0.9 % SODIUM CHLORIDE (POUR BTL) OPTIME
TOPICAL | Status: DC | PRN
  Administered 2019-06-10: 1000 mL

## 2019-06-10 SURGICAL SUPPLY — 28 items
BNDG GAUZE ELAST 4 BULKY (GAUZE/BANDAGES/DRESSINGS) ×2 IMPLANT
CANISTER SUCT 3000ML PPV (MISCELLANEOUS) ×3 IMPLANT
COVER SURGICAL LIGHT HANDLE (MISCELLANEOUS) ×3 IMPLANT
COVER WAND RF STERILE (DRAPES) ×3 IMPLANT
DRAPE LAPAROSCOPIC ABDOMINAL (DRAPES) ×3 IMPLANT
DRSG PAD ABDOMINAL 8X10 ST (GAUZE/BANDAGES/DRESSINGS) ×2 IMPLANT
ELECT REM PT RETURN 9FT ADLT (ELECTROSURGICAL) ×3
ELECTRODE REM PT RTRN 9FT ADLT (ELECTROSURGICAL) ×1 IMPLANT
GAUZE SPONGE 4X4 12PLY STRL (GAUZE/BANDAGES/DRESSINGS) IMPLANT
GLOVE BIO SURGEON STRL SZ8 (GLOVE) ×3 IMPLANT
GLOVE BIOGEL PI IND STRL 8 (GLOVE) ×1 IMPLANT
GLOVE BIOGEL PI INDICATOR 8 (GLOVE) ×2
GOWN STRL REUS W/ TWL LRG LVL3 (GOWN DISPOSABLE) ×1 IMPLANT
GOWN STRL REUS W/ TWL XL LVL3 (GOWN DISPOSABLE) ×1 IMPLANT
GOWN STRL REUS W/TWL LRG LVL3 (GOWN DISPOSABLE) ×6
GOWN STRL REUS W/TWL XL LVL3 (GOWN DISPOSABLE) ×3
KIT BASIN OR (CUSTOM PROCEDURE TRAY) ×3 IMPLANT
KIT TURNOVER KIT B (KITS) ×3 IMPLANT
NS IRRIG 1000ML POUR BTL (IV SOLUTION) ×3 IMPLANT
PACK GENERAL/GYN (CUSTOM PROCEDURE TRAY) ×3 IMPLANT
PAD ABD 7.5X8 STRL (GAUZE/BANDAGES/DRESSINGS) ×2 IMPLANT
PAD ARMBOARD 7.5X6 YLW CONV (MISCELLANEOUS) ×6 IMPLANT
PENCIL SMOKE EVACUATOR (MISCELLANEOUS) ×3 IMPLANT
SPECIMEN JAR SMALL (MISCELLANEOUS) IMPLANT
SWAB COLLECTION DEVICE MRSA (MISCELLANEOUS) ×4 IMPLANT
SWAB CULTURE ESWAB REG 1ML (MISCELLANEOUS) ×2 IMPLANT
TOWEL GREEN STERILE (TOWEL DISPOSABLE) ×3 IMPLANT
TOWEL GREEN STERILE FF (TOWEL DISPOSABLE) ×3 IMPLANT

## 2019-06-10 NOTE — Consult Note (Signed)
[facility] Surgery Consult Note  Saqib Kathi Simpers 28-Jan-1976  [id number].    Requesting MD: Little  Chief Complaint/Reason for Consult: Groin abscess HPI:  Patient is a 43 year old male who presented today with groin swelling and pain. Underwent I&D of left groin abscess 10/23 at urgent care and started on doxycycline. He planned to return to urgent care today for re-evaluation but noticed increased swelling and pain when he woke up this AM and came to the ED. Has not been packing wound with anything, and wound has been draining purulent drainage. Also notes significant penile swelling but denies dysuria or discharge. Patient denies fever, chills, chest pain, SOB, abdominal pain, change in bowel habits. He does report occasional numbness and tingling in feet at times that has been intermittent over the last several weeks-months. Workup in the ED revealed elevated blood glucose >300, glucose seen in urine as well. PMH significant for HTN that is currently untreated and multiple psychiatric diagnoses for which patient does not take medications. NKDA. Patient reports he smokes 1/2 ppd, marijuana, and drinks heavily on the weekends but denies ever having alcohol withdrawal. He is on disability and does not work.   ROS: Review of Systems  Constitutional: Negative for chills and fever.  Respiratory: Negative for shortness of breath and wheezing.   Cardiovascular: Negative for chest pain and palpitations.  Gastrointestinal: Negative for abdominal pain, constipation, diarrhea, nausea and vomiting.  Genitourinary: Negative for dysuria, frequency and urgency.  Musculoskeletal: Positive for back pain (chronic ).  Neurological: Positive for tingling (chronic in LEs).  Psychiatric/Behavioral:       Hx of psychiatric diagnoses, pt not taking medications for these  All other systems reviewed and are negative.   Family History  Family history unknown: Yes    Past Medical History:  Diagnosis Date   . Bipolar 1 disorder ([place])   . Chronic back pain   . Hypertension   . Intermittent explosive disorder   . 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 PTSD (post-traumatic stress disorder)     Past Surgical History:  Procedure Laterality Date  . FRACTURE SURGERY      Social History:  reports that he has been smoking cigarettes. He has never used smokeless tobacco. He reports current alcohol use. He reports that he does not use drugs.  Allergies: No Known Allergies  (Not in a hospital admission)   Blood pressure 123/68, pulse 95, temperature 99 F (37.2 C), temperature source Oral, resp. rate 16, height 6\' 1"  (1.854 m), weight (!) 147.4 kg, SpO2 98 %. Physical Exam: Physical Exam Exam conducted with a chaperone present.  Constitutional:      General: He is not in acute distress.    Appearance: He is well-developed. He is morbidly obese. He is not toxic-appearing.  HENT:     Head: Normocephalic and atraumatic.     Right Ear: External ear normal.     Left Ear: External ear normal.     Nose: Nose normal.  Eyes:     General: Lids are normal. No scleral icterus.    Extraocular Movements: Extraocular movements intact.     Conjunctiva/sclera: Conjunctivae normal.  Neck:     Musculoskeletal: Normal range of motion and neck supple.  Cardiovascular:     Rate and Rhythm: Normal rate and regular rhythm.     Pulses:          Radial pulses are 2+ on the right side and 2+ on the left side.  Dorsalis pedis pulses are 2+ on the right side and 2+ on the left side.  Pulmonary:     Effort: Pulmonary effort is normal.     Breath sounds: Normal breath sounds. No decreased breath sounds, wheezing, rhonchi or rales.  Abdominal:     General: There is no distension.     Palpations: Abdomen is soft.     Tenderness: There is no abdominal tenderness.  Genitourinary:    Penis: Swelling present.      Comments: Erythema and induration of mons and L groin, purulent drainage, edema of penile shaft without  erythema, scrotal erythema without induration or tenderness Musculoskeletal:     Comments: ROM grossly intact in all 4 extremities, clubbing of R foot  Skin:    General: Skin is warm and dry.     Findings: No rash.  Neurological:     Mental Status: He is alert and oriented to person, place, and time.     Sensory: Sensation is intact.     Motor: Motor function is intact.  Psychiatric:        Attention and Perception: Attention normal.        Mood and Affect: Affect is flat.        Speech: Speech normal.        Behavior: Behavior normal. Behavior is cooperative.     Results for orders placed or performed during the hospital encounter of 06/10/19 (from the past 48 hour(s))  Comprehensive metabolic panel     Status: Abnormal   Collection Time: 06/10/19  8:23 AM  Result Value Ref Range   Sodium 135 135 - 145 mmol/L   Potassium 3.3 (L) 3.5 - 5.1 mmol/L   Chloride 100 98 - 111 mmol/L   CO2 16 (L) 22 - 32 mmol/L   Glucose, Bld 353 (H) 70 - 99 mg/dL   BUN 5 (L) 6 - 20 mg/dL   Creatinine, Ser 1.611.10 0.61 - 1.24 mg/dL   Calcium 9.0 8.9 - 09.610.3 mg/dL   Total Protein 6.7 6.5 - 8.1 g/dL   Albumin 2.6 (L) 3.5 - 5.0 g/dL   AST 14 (L) 15 - 41 U/L   ALT 18 0 - 44 U/L   Alkaline Phosphatase 114 38 - 126 U/L   Total Bilirubin 1.3 (H) 0.3 - 1.2 mg/dL   GFR calc non Af Amer >60 >60 mL/min   GFR calc Af Amer >60 >60 mL/min   Anion gap 19 (H) 5 - 15    Comment: Performed at [facility], [address] [place], [place] [id number]  CBC with Differential     Status: Abnormal   Collection Time: 06/10/19  8:23 AM  Result Value Ref Range   WBC 14.0 (H) 4.0 - 10.5 K/uL   RBC 4.88 4.22 - 5.81 MIL/uL   Hemoglobin 12.8 (L) 13.0 - 17.0 g/dL   HCT 98.139.9 19.139.0 - 47.852.0 %   MCV 81.8 80.0 - 100.0 fL   MCH 26.2 26.0 - 34.0 pg   MCHC 32.1 30.0 - 36.0 g/dL   RDW 29.514.3 62.111.5 - 30.815.5 %   Platelets 257 150 - 400 K/uL   nRBC 0.0 0.0 - 0.2 %   Neutrophils Relative % 74 %   Neutro Abs 10.4 (H) 1.7 - 7.7 K/uL    Lymphocytes Relative 11 %   Lymphs Abs 1.5 0.7 - 4.0 K/uL   Monocytes Relative 8 %   Monocytes Absolute 1.1 (H) 0.1 - 1.0 K/uL   Eosinophils Relative  2 %   Eosinophils Absolute 0.3 0.0 - 0.5 K/uL   Basophils Relative 1 %   Basophils Absolute 0.1 0.0 - 0.1 K/uL   Immature Granulocytes 4 %   Abs Immature Granulocytes 0.58 (H) 0.00 - 0.07 K/uL    Comment: Performed at [facility], [address] [place], [place] [address]  Lactic acid, plasma     Status: None   Collection Time: 06/10/19  8:23 AM  Result Value Ref Range   Lactic Acid, Venous 1.6 0.5 - 1.9 mmol/L    Comment: Performed at [facility], [address] [place], [place] [address]  Urinalysis, Routine w reflex microscopic     Status: Abnormal   Collection Time: 06/10/19  8:23 AM  Result Value Ref Range   Color, Urine YELLOW YELLOW   APPearance CLEAR CLEAR   Specific Gravity, Urine 1.031 (H) 1.005 - 1.030   pH 5.0 5.0 - 8.0   Glucose, UA >=500 (A) NEGATIVE mg/dL   Hgb urine dipstick SMALL (A) NEGATIVE   Bilirubin Urine NEGATIVE NEGATIVE   Ketones, ur 80 (A) NEGATIVE mg/dL   Protein, ur 30 (A) NEGATIVE mg/dL   Nitrite NEGATIVE NEGATIVE   Leukocytes,Ua NEGATIVE NEGATIVE   WBC, UA 0-5 0 - 5 WBC/hpf   Bacteria, UA NONE SEEN NONE SEEN   Squamous Epithelial / LPF 0-5 0 - 5    Comment: Performed at [facility], [address], [place], [place] [address]  I-stat chem 8, ED (not at [facility] or [facility])     Status: Abnormal   Collection Time: 06/10/19  9:00 AM  Result Value Ref Range   Sodium 134 (L) 135 - 145 mmol/L   Potassium 5.5 (H) 3.5 - 5.1 mmol/L   Chloride 104 98 - 111 mmol/L   BUN 8 6 - 20 mg/dL   Creatinine, Ser 8.30 0.61 - 1.24 mg/dL   Glucose, Bld 940 (H) 70 - 99 mg/dL   Calcium, Ion 7.68 (L) 1.15 - 1.40 mmol/L   TCO2 19 (L) 22 - 32 mmol/L   Hemoglobin 12.9 (L) 13.0 - 17.0 g/dL   HCT 08.8 (L) 11.0 - 31.5 %   No results found.    Assessment/Plan HTN - untreated T2DM, uncontrolled with  neuropathy - new onset, needs medical admission and management of this  Chronic back pain Bipolar disorder Multiple personality disorder Intermittent explosive disorder PTSD  Fournier's gangrene - s/p bedside I&D 10/23 - WBC 14, PO Tmax 99, tachycardic, not hypotensive - significant penile edema as well, recommend urology consult - recommend operative drainage for better source control - BS abx, will send operative cxs  Wells Guiles, [facility] Surgery 06/10/2019, 9:49 AM Please see Amion for pager number during day hours 7:00am-4:30pm

## 2019-06-10 NOTE — Anesthesia Preprocedure Evaluation (Addendum)
Anesthesia Evaluation  Patient identified by MRN, date of birth, ID band Patient awake    Reviewed: Allergy & Precautions, NPO status , Patient's Chart, lab work & pertinent test results  History of Anesthesia Complications Negative for: history of anesthetic complications  Airway Mallampati: III  TM Distance: >3 FB Neck ROM: Full    Dental  (+) Poor Dentition, Dental Advisory Given   Pulmonary neg recent URI, Current Smoker and Patient abstained from smoking.,    breath sounds clear to auscultation       Cardiovascular hypertension,  Rhythm:Regular     Neuro/Psych PSYCHIATRIC DISORDERS Anxiety Bipolar Disorder negative neurological ROS     GI/Hepatic negative GI ROS, Neg liver ROS,   Endo/Other  diabetesMorbid obesity  Renal/GU negative Renal ROS     Musculoskeletal negative musculoskeletal ROS (+)   Abdominal   Peds  Hematology  (+) anemia ,   Anesthesia Other Findings   Reproductive/Obstetrics                            Anesthesia Physical Anesthesia Plan  ASA: III  Anesthesia Plan: General   Post-op Pain Management:    Induction: Intravenous  PONV Risk Score and Plan: 1 and Treatment may vary due to age or medical condition, Ondansetron and Dexamethasone  Airway Management Planned: Oral ETT  Additional Equipment: None  Intra-op Plan:   Post-operative Plan: Extubation in OR  Informed Consent: I have reviewed the patients History and Physical, chart, labs and discussed the procedure including the risks, benefits and alternatives for the proposed anesthesia with the patient or authorized representative who has indicated his/her understanding and acceptance.     Dental advisory given  Plan Discussed with: CRNA and Surgeon  Anesthesia Plan Comments:         Anesthesia Quick Evaluation

## 2019-06-10 NOTE — H&P (View-Only) (Signed)
[facility] Surgery Consult Note  Mario Little 28-Jan-1976  [id number].    Requesting MD: Little  Chief Complaint/Reason for Consult: Groin abscess HPI:  Patient is a 43 year old male who presented today with groin swelling and pain. Underwent I&D of left groin abscess 10/23 at urgent care and started on doxycycline. He planned to return to urgent care today for re-evaluation but noticed increased swelling and pain when he woke up this AM and came to the ED. Has not been packing wound with anything, and wound has been draining purulent drainage. Also notes significant penile swelling but denies dysuria or discharge. Patient denies fever, chills, chest pain, SOB, abdominal pain, change in bowel habits. He does report occasional numbness and tingling in feet at times that has been intermittent over the last several weeks-months. Workup in the ED revealed elevated blood glucose >300, glucose seen in urine as well. PMH significant for HTN that is currently untreated and multiple psychiatric diagnoses for which patient does not take medications. NKDA. Patient reports he smokes 1/2 ppd, marijuana, and drinks heavily on the weekends but denies ever having alcohol withdrawal. He is on disability and does not work.   ROS: Review of Systems  Constitutional: Negative for chills and fever.  Respiratory: Negative for shortness of breath and wheezing.   Cardiovascular: Negative for chest pain and palpitations.  Gastrointestinal: Negative for abdominal pain, constipation, diarrhea, nausea and vomiting.  Genitourinary: Negative for dysuria, frequency and urgency.  Musculoskeletal: Positive for back pain (chronic ).  Neurological: Positive for tingling (chronic in LEs).  Psychiatric/Behavioral:       Hx of psychiatric diagnoses, pt not taking medications for these  All other systems reviewed and are negative.   Family History  Family history unknown: Yes    Past Medical History:  Diagnosis Date   . Bipolar 1 disorder ([place])   . Chronic back pain   . Hypertension   . Intermittent explosive disorder   . 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 PTSD (post-traumatic stress disorder)     Past Surgical History:  Procedure Laterality Date  . FRACTURE SURGERY      Social History:  reports that he has been smoking cigarettes. He has never used smokeless tobacco. He reports current alcohol use. He reports that he does not use drugs.  Allergies: No Known Allergies  (Not in a hospital admission)   Blood pressure 123/68, pulse 95, temperature 99 F (37.2 C), temperature source Oral, resp. rate 16, height 6\' 1"  (1.854 m), weight (!) 147.4 kg, SpO2 98 %. Physical Exam: Physical Exam Exam conducted with a chaperone present.  Constitutional:      General: He is not in acute distress.    Appearance: He is well-developed. He is morbidly obese. He is not toxic-appearing.  HENT:     Head: Normocephalic and atraumatic.     Right Ear: External ear normal.     Left Ear: External ear normal.     Nose: Nose normal.  Eyes:     General: Lids are normal. No scleral icterus.    Extraocular Movements: Extraocular movements intact.     Conjunctiva/sclera: Conjunctivae normal.  Neck:     Musculoskeletal: Normal range of motion and neck supple.  Cardiovascular:     Rate and Rhythm: Normal rate and regular rhythm.     Pulses:          Radial pulses are 2+ on the right side and 2+ on the left side.  Dorsalis pedis pulses are 2+ on the right side and 2+ on the left side.  Pulmonary:     Effort: Pulmonary effort is normal.     Breath sounds: Normal breath sounds. No decreased breath sounds, wheezing, rhonchi or rales.  Abdominal:     General: There is no distension.     Palpations: Abdomen is soft.     Tenderness: There is no abdominal tenderness.  Genitourinary:    Penis: Swelling present.      Comments: Erythema and induration of mons and L groin, purulent drainage, edema of penile shaft without  erythema, scrotal erythema without induration or tenderness Musculoskeletal:     Comments: ROM grossly intact in all 4 extremities, clubbing of R foot  Skin:    General: Skin is warm and dry.     Findings: No rash.  Neurological:     Mental Status: He is alert and oriented to person, place, and time.     Sensory: Sensation is intact.     Motor: Motor function is intact.  Psychiatric:        Attention and Perception: Attention normal.        Mood and Affect: Affect is flat.        Speech: Speech normal.        Behavior: Behavior normal. Behavior is cooperative.     Results for orders placed or performed during the hospital encounter of 06/10/19 (from the past 48 hour(s))  Comprehensive metabolic panel     Status: Abnormal   Collection Time: 06/10/19  8:23 AM  Result Value Ref Range   Sodium 135 135 - 145 mmol/L   Potassium 3.3 (L) 3.5 - 5.1 mmol/L   Chloride 100 98 - 111 mmol/L   CO2 16 (L) 22 - 32 mmol/L   Glucose, Bld 353 (H) 70 - 99 mg/dL   BUN 5 (L) 6 - 20 mg/dL   Creatinine, Ser 1.10 0.61 - 1.24 mg/dL   Calcium 9.0 8.9 - 10.3 mg/dL   Total Protein 6.7 6.5 - 8.1 g/dL   Albumin 2.6 (L) 3.5 - 5.0 g/dL   AST 14 (L) 15 - 41 U/L   ALT 18 0 - 44 U/L   Alkaline Phosphatase 114 38 - 126 U/L   Total Bilirubin 1.3 (H) 0.3 - 1.2 mg/dL   GFR calc non Af Amer >60 >60 mL/min   GFR calc Af Amer >60 >60 mL/min   Anion gap 19 (H) 5 - 15    Comment: Performed at  Hospital [facility], [address] [place], [place] [address]  CBC with Differential     Status: Abnormal   Collection Time: 06/10/19  8:23 AM  Result Value Ref Range   WBC 14.0 (H) 4.0 - 10.5 K/uL   RBC 4.88 4.22 - 5.81 MIL/uL   Hemoglobin 12.8 (L) 13.0 - 17.0 g/dL   HCT 39.9 39.0 - 52.0 %   MCV 81.8 80.0 - 100.0 fL   MCH 26.2 26.0 - 34.0 pg   MCHC 32.1 30.0 - 36.0 g/dL   RDW 14.3 11.5 - 15.5 %   Platelets 257 150 - 400 K/uL   nRBC 0.0 0.0 - 0.2 %   Neutrophils Relative % 74 %   Neutro Abs 10.4 (H) 1.7 - 7.7 K/uL    Lymphocytes Relative 11 %   Lymphs Abs 1.5 0.7 - 4.0 K/uL   Monocytes Relative 8 %   Monocytes Absolute 1.1 (H) 0.1 - 1.0 K/uL   Eosinophils Relative   2 %   Eosinophils Absolute 0.3 0.0 - 0.5 K/uL   Basophils Relative 1 %   Basophils Absolute 0.1 0.0 - 0.1 K/uL   Immature Granulocytes 4 %   Abs Immature Granulocytes 0.58 (H) 0.00 - 0.07 K/uL    Comment: Performed at [facility], [address] [place], [place] [address]  Lactic acid, plasma     Status: None   Collection Time: 06/10/19  8:23 AM  Result Value Ref Range   Lactic Acid, Venous 1.6 0.5 - 1.9 mmol/L    Comment: Performed at [facility], [address] [place], [place] [address]  Urinalysis, Routine w reflex microscopic     Status: Abnormal   Collection Time: 06/10/19  8:23 AM  Result Value Ref Range   Color, Urine YELLOW YELLOW   APPearance CLEAR CLEAR   Specific Gravity, Urine 1.031 (H) 1.005 - 1.030   pH 5.0 5.0 - 8.0   Glucose, UA >=500 (A) NEGATIVE mg/dL   Hgb urine dipstick SMALL (A) NEGATIVE   Bilirubin Urine NEGATIVE NEGATIVE   Ketones, ur 80 (A) NEGATIVE mg/dL   Protein, ur 30 (A) NEGATIVE mg/dL   Nitrite NEGATIVE NEGATIVE   Leukocytes,Ua NEGATIVE NEGATIVE   WBC, UA 0-5 0 - 5 WBC/hpf   Bacteria, UA NONE SEEN NONE SEEN   Squamous Epithelial / LPF 0-5 0 - 5    Comment: Performed at [facility], [address], [place], [place] [address]  I-stat chem 8, ED (not at [facility] or [facility])     Status: Abnormal   Collection Time: 06/10/19  9:00 AM  Result Value Ref Range   Sodium 134 (L) 135 - 145 mmol/L   Potassium 5.5 (H) 3.5 - 5.1 mmol/L   Chloride 104 98 - 111 mmol/L   BUN 8 6 - 20 mg/dL   Creatinine, Ser 8.30 0.61 - 1.24 mg/dL   Glucose, Bld 940 (H) 70 - 99 mg/dL   Calcium, Ion 7.68 (L) 1.15 - 1.40 mmol/L   TCO2 19 (L) 22 - 32 mmol/L   Hemoglobin 12.9 (L) 13.0 - 17.0 g/dL   HCT 08.8 (L) 11.0 - 31.5 %   No results found.    Assessment/Plan HTN - untreated T2DM, uncontrolled with  neuropathy - new onset, needs medical admission and management of this  Chronic back pain Bipolar disorder Multiple personality disorder Intermittent explosive disorder PTSD  Fournier's gangrene - s/p bedside I&D 10/23 - WBC 14, PO Tmax 99, tachycardic, not hypotensive - significant penile edema as well, recommend urology consult - recommend operative drainage for better source control - BS abx, will send operative cxs  Wells Guiles, [facility] Surgery 06/10/2019, 9:49 AM Please see Amion for pager number during day hours 7:00am-4:30pm

## 2019-06-10 NOTE — Interval H&P Note (Signed)
History and Physical Interval Note:  06/10/2019 1:24 PM  Mario Little  has presented today for surgery, with the diagnosis of fournier's gangrene.  The various methods of treatment have been discussed with the patient and family. After consideration of risks, benefits and other options for treatment, the patient has consented to  Procedure(s): INCISION AND DRAINAGE (N/A) as a surgical intervention.  The patient's history has been reviewed, patient examined, no change in status, stable for surgery.  I have reviewed the patient's chart and labs.  Questions were answered to the patient's satisfaction.     Brush

## 2019-06-10 NOTE — ED Notes (Signed)
Pt has 4+ edema of penis. 2+ edema of left upper leg. Pelvic area is erythematous

## 2019-06-10 NOTE — Progress Notes (Signed)
Pharmacy Antibiotic Note  Mario Little is a 43 y.o. male admitted on 06/10/2019 with sepsis and cellulitis. Pt recently s/p I&D and started on doxy 55/73 for folliculitis and abscess of L groin, returns today with worsening pain. Pharmacy has been consulted for vancomycin dosing. Also with 1x doses of Clinda and ceftriaxone ordered per EDP. Afebrile, WBC 14, LA 1.6. SCr 1.1 on admit.  Plan: Vancomycin 2g IV x 1; then Vancomycin 1500 mg IV Q 12 hrs. Goal AUC 400-550. Expected AUC: 466 SCr used: 1.1 Ceftriaxone 2g IV x 1 + Clinda 900mg  IV x 1 per EDP - f/u if to continue Monitor clinical progress, c/s, renal function F/u de-escalation plan/LOT, vancomycin levels as indicated    Height: 6\' 1"  (185.4 cm) Weight: (!) 325 lb (147.4 kg) IBW/kg (Calculated) : 79.9  Temp (24hrs), Avg:99 F (37.2 C), Min:99 F (37.2 C), Max:99 F (37.2 C)  Recent Labs  Lab 06/10/19 0823  WBC 14.0*  CREATININE 1.10  LATICACIDVEN 1.6    Estimated Creatinine Clearance: 133.6 mL/min (by C-G formula based on SCr of 1.1 mg/dL).    No Known Allergies  Elicia Lamp, PharmD, BCPS Please check AMION for all [facility] contact numbers Clinical Pharmacist 06/10/2019 9:11 AM

## 2019-06-10 NOTE — ED Notes (Signed)
Pt states at urgent care he had abcess lanced in left groin and now pain radiated to right groin area to upper right leg. Pt states its 10/10 pain.

## 2019-06-10 NOTE — ED Notes (Signed)
Report given to Minna Merritts, RN in [facility]

## 2019-06-10 NOTE — ED Provider Notes (Signed)
[facility] EMERGENCY DEPARTMENT Provider Note   CSN: [id number] Arrival date & time: 06/10/19  1191     History   Chief Complaint Chief Complaint  Patient presents with   Groin Pain    HPI Mario Little is a 43 y.o. male.     43yo M w/ PMH below including bipolar d/o, HTN, chronic back pain who presents with groin infection.  The patient presented to urgent care 2 days ago for a left groin abscess which was incised and drained and he was started on doxycycline.  He has been taking the medication but reports that the area has significantly enlarged and become excruciatingly painful.  He has had swelling extending to his penis and scrotum and has not been able to apply ice packs due to the severity of pain.  He has had copious foul-smelling discharge.  He reports difficulty urinating.  No known fevers.  He denies any history of diabetes diagnosis.  The history is provided by the patient.    Past Medical History:  Diagnosis Date   Bipolar 1 disorder (HCC)    Chronic back pain    Complication of anesthesia    Hard to put to sleep   Hypertension    Intermittent explosive disorder    Multiple personality (HCC)    PTSD (post-traumatic stress disorder)     Patient Active Problem List   Diagnosis Date Noted   Fournier's gangrene 06/10/2019    Past Surgical History:  Procedure Laterality Date   FRACTURE SURGERY          Home Medications    Prior to Admission medications   Medication Sig Start Date End Date Taking? Authorizing Provider  doxycycline (VIBRAMYCIN) 100 MG capsule Take 1 capsule (100 mg total) by mouth 2 (two) times daily. 06/08/19  Yes Wallis Bamberg, PA-C  gabapentin (NEURONTIN) 300 MG capsule Take 2 capsules (600 mg total) by mouth 3 (three) times daily. 06/01/19  Yes Hudnall, Azucena Fallen, MD  ibuprofen (ADVIL) 800 MG tablet Take 1 tablet (800 mg total) by mouth every 8 (eight) hours as needed. 04/30/19  Yes Hudnall, Azucena Fallen, MD    oxyCODONE-acetaminophen (PERCOCET/ROXICET) 5-325 MG tablet Take 1 tablet by mouth every 6 (six) hours as needed for severe pain. Patient not taking: Reported on 06/10/2019 06/01/19   Lenda Kelp, MD  tiZANidine (ZANAFLEX) 4 MG tablet Take 1 tablet (4 mg total) by mouth every 8 (eight) hours as needed. 06/01/19   Lenda Kelp, MD    Family History Family History  Family history unknown: Yes    Social History Social History   Tobacco Use   Smoking status: Current Every Day Smoker    Packs/day: 0.50    Types: Cigarettes   Smokeless tobacco: Never Used  Substance Use Topics   Alcohol use: Yes    Comment: occ   Drug use: Never     Allergies   Patient has no known allergies.   Review of Systems Review of Systems All other systems reviewed and are negative except that which was mentioned in HPI   Physical Exam Updated Vital Signs BP 135/72    Pulse (!) 114    Temp 98.9 F (37.2 C)    Resp 18    Ht  (1.854 m)    Wt (!) 147.4 kg    SpO2 94%    BMI 42.88 kg/m   Physical Exam Vitals signs and nursing note reviewed. Exam conducted with a chaperone present.  Constitutional:      General: He is not in acute distress.    Appearance: He is well-developed.     Comments: In distress due to pain  HENT:     Head: Normocephalic and atraumatic.  Eyes:     Conjunctiva/sclera: Conjunctivae normal.  Neck:     Musculoskeletal: Neck supple.  Cardiovascular:     Rate and Rhythm: Regular rhythm. Tachycardia present.     Heart sounds: Normal heart sounds. No murmur.  Pulmonary:     Effort: Pulmonary effort is normal.     Breath sounds: Normal breath sounds.  Abdominal:     General: Bowel sounds are normal. There is no distension.     Palpations: Abdomen is soft.     Tenderness: There is no abdominal tenderness.  Genitourinary:    Comments: Extensive erythema and induration of mons pubis w/ edema tracking down inguinal canals to scrotum and penis; copious  foul-smelling discharge from L I&D site; exquisite tenderness Skin:    General: Skin is warm and dry.  Neurological:     Mental Status: He is alert and oriented to person, place, and time.     Comments: Fluent speech  Psychiatric:     Comments: Distressed, anxious      ED Treatments / Results  Labs (all labs ordered are listed, but only abnormal results are displayed) Labs Reviewed  COMPREHENSIVE METABOLIC PANEL - Abnormal; Notable for the following components:      Result Value   Potassium 3.3 (*)    CO2 16 (*)    Glucose, Bld 353 (*)    BUN 5 (*)    Albumin 2.6 (*)    AST 14 (*)    Total Bilirubin 1.3 (*)    Anion gap 19 (*)    All other components within normal limits  CBC WITH DIFFERENTIAL/PLATELET - Abnormal; Notable for the following components:   WBC 14.0 (*)    Hemoglobin 12.8 (*)    Neutro Abs 10.4 (*)    Monocytes Absolute 1.1 (*)    Abs Immature Granulocytes 0.58 (*)    All other components within normal limits  URINALYSIS, ROUTINE W REFLEX MICROSCOPIC - Abnormal; Notable for the following components:   Specific Gravity, Urine 1.031 (*)    Glucose, UA >=500 (*)    Hgb urine dipstick SMALL (*)    Ketones, ur 80 (*)    Protein, ur 30 (*)    All other components within normal limits  BASIC METABOLIC PANEL - Abnormal; Notable for the following components:   Potassium 3.2 (*)    CO2 16 (*)    Glucose, Bld 294 (*)    Calcium 8.4 (*)    Anion gap 16 (*)    All other components within normal limits  GLUCOSE, CAPILLARY - Abnormal; Notable for the following components:   Glucose-Capillary 217 (*)    All other components within normal limits  I-STAT CHEM 8, ED - Abnormal; Notable for the following components:   Sodium 134 (*)    Potassium 5.5 (*)    Glucose, Bld 336 (*)    Calcium, Ion 1.09 (*)    TCO2 19 (*)    Hemoglobin 12.9 (*)    HCT 38.0 (*)    All other components within normal limits  POCT I-STAT EG7 - Abnormal; Notable for the following components:    pCO2, Ven 31.2 (*)    pO2, Ven 119.0 (*)    Bicarbonate 18.8 (*)    TCO2 20 (*)  Acid-base deficit 5.0 (*)    Sodium 134 (*)    Potassium 5.5 (*)    Calcium, Ion 1.12 (*)    HCT 38.0 (*)    Hemoglobin 12.9 (*)    All other components within normal limits  SARS CORONAVIRUS 2 BY RT PCR (HOSPITAL ORDER, PERFORMED IN [facility])  CULTURE, BLOOD (ROUTINE X 2)  CULTURE, BLOOD (ROUTINE X 2)  URINE CULTURE  AEROBIC/ANAEROBIC CULTURE (SURGICAL/DEEP WOUND)  LACTIC ACID, PLASMA  LACTIC ACID, PLASMA  HIV ANTIBODY (ROUTINE TESTING W REFLEX)  I-STAT VENOUS BLOOD GAS, ED    EKG None  Radiology Ct Abdomen Pelvis W Contrast  Result Date: 06/10/2019 CLINICAL DATA:  Clinical concern for groin infection and Fournier gangrene. EXAM: CT ABDOMEN AND PELVIS WITH CONTRAST TECHNIQUE: Multidetector CT imaging of the abdomen and pelvis was performed using the standard protocol following bolus administration of intravenous contrast. CONTRAST:  OMNIPAQUE IOHEXOL 300 MG/ML  SOLN COMPARISON:  None. FINDINGS: Lower chest: There is mild right basilar atelectasis. Hepatobiliary: No focal liver abnormality is seen. No gallstones, gallbladder wall thickening, or biliary dilatation. Pancreas: Unremarkable. No pancreatic ductal dilatation or surrounding inflammatory changes. Spleen: Normal in size without focal abnormality. Adrenals/Urinary Tract: A left adrenal mass measures 2.1 cm and measures less than 10 Hounsfield units, consistent with a benign adrenal adenoma or myelolipoma. Kidneys are normal, without renal calculi, focal lesion, or hydronephrosis. Bladder is unremarkable. Stomach/Bowel: Stomach is within normal limits. Appendix appears normal. No evidence of bowel wall thickening, distention, or inflammatory changes. Vascular/Lymphatic: No significant vascular findings are present. No enlarged abdominal lymph nodes. Reproductive: Prostate is unremarkable. Other: There is soft tissue edema and  disorganized fluid with subcutaneous emphysema and overlying skin thickening involving the groin, concerning for Fournier's gangrene. The penis and scrotum are incompletely imaged on this exam. Multiple enlarged bilateral inguinal and iliac chain lymph nodes are likely reactive. There is no involvement of the perianal region or retroperitoneal/intraperitoneal extension. There is no well-defined rim enhancing fluid collection to suggest abscess. No abdominal wall hernia. No abdominopelvic ascites. Musculoskeletal: No focal osseous demineralization to suggest osteomyelitis. Degenerative changes are seen in both hips and the lumbar spine. IMPRESSION: Soft tissue edema with subcutaneous emphysema and overlying skin thickening involving the groin is consistent with Fournier gangrene. Of note, the penis and scrotum are incompletely imaged. No well-defined fluid collection to suggest abscess. No involvement of the perianal region. No evidence of retroperitoneal or intraperitoneal extension. Bilateral inguinal and iliac chain lymph nodes are likely reactive. Electronically Signed   By: Romona Curls M.D.   On: 06/10/2019 10:56    Procedures .Critical Care Performed by: Laurence Spates, MD Authorized by: Laurence Spates, MD   Critical care provider statement:    Critical care time (minutes):  45   Critical care time was exclusive of:  Separately billable procedures and treating other patients   Critical care was necessary to treat or prevent imminent or life-threatening deterioration of the following conditions:  Sepsis   Critical care was time spent personally by me on the following activities:  Development of treatment plan with patient or surrogate, discussions with consultants, evaluation of patient's response to treatment, examination of patient, obtaining history from patient or surrogate, ordering and performing treatments and interventions, ordering and review of laboratory studies, ordering  and review of radiographic studies, re-evaluation of patient's condition and review of old charts   (including critical care time)  Medications Ordered in ED Medications  vancomycin (VANCOCIN) 1,500  mg in sodium chloride 0.9 % 500 mL IVPB ( Intravenous Automatically Held 06/18/19 2200)  insulin glargine (LANTUS) injection 15 Units ( Subcutaneous Automatically Held 06/18/19 1000)  piperacillin-tazobactam (ZOSYN) IVPB 3.375 g ( Intravenous Automatically Held 06/18/19 1800)  acetaminophen (TYLENOL) tablet 1,000 mg (has no administration in time range)    Or  acetaminophen (TYLENOL) 160 MG/5ML solution 1,000 mg (has no administration in time range)  oxyCODONE (Oxy IR/ROXICODONE) immediate release tablet 5 mg (has no administration in time range)    Or  oxyCODONE (ROXICODONE) 5 MG/5ML solution 5 mg (has no administration in time range)  fentaNYL (SUBLIMAZE) injection 25-50 mcg (50 mcg Intravenous Given 06/10/19 1525)  acetaminophen (OFIRMEV) IV 1,000 mg (has no administration in time range)  gabapentin (NEURONTIN) capsule 600 mg (has no administration in time range)  tiZANidine (ZANAFLEX) tablet 4 mg (has no administration in time range)  morphine 4 MG/ML injection 4 mg (has no administration in time range)  polyethylene glycol (MIRALAX / GLYCOLAX) packet 17 g (has no administration in time range)  ondansetron (ZOFRAN) injection 4 mg (has no administration in time range)  insulin aspart (novoLOG) injection 0-9 Units (has no administration in time range)  insulin aspart (novoLOG) injection 0-5 Units (has no administration in time range)  lactated ringers infusion ( Intravenous Anesthesia Volume Adjustment 06/10/19 1453)  cefTRIAXone (ROCEPHIN) 2 g in sodium chloride 0.9 % 100 mL IVPB (0 g Intravenous Stopped 06/10/19 0926)  lactated ringers bolus 1,500 mL (0 mLs Intravenous Stopped 06/10/19 0926)  HYDROmorphone (DILAUDID) injection 1 mg (1 mg Intravenous Given 06/10/19 0839)  clindamycin (CLEOCIN)  IVPB 900 mg (0 mg Intravenous Stopped 06/10/19 0926)  vancomycin (VANCOCIN) 2,000 mg in sodium chloride 0.9 % 500 mL IVPB (0 mg Intravenous Stopped 06/10/19 1153)  lactated ringers bolus 1,000 mL (0 mLs Intravenous Stopped 06/10/19 1124)  iohexol (OMNIPAQUE) 300 MG/ML solution 125 mL (100 mLs Intravenous Contrast Given 06/10/19 0952)  HYDROmorphone (DILAUDID) injection 1 mg (1 mg Intravenous Given 06/10/19 1052)  piperacillin-tazobactam (ZOSYN) IVPB 3.375 g (0 g Intravenous Stopped 06/10/19 1154)  HYDROmorphone (DILAUDID) injection 1 mg (1 mg Intravenous Given 06/10/19 1259)  fentaNYL (SUBLIMAZE) 100 MCG/2ML injection (  Duplicate 06/10/19 1522)  insulin aspart (novoLOG) injection 6 Units (6 Units Subcutaneous Given 06/10/19 1523)  propofol (DIPRIVAN) 10 mg/mL bolus/IV push (has no administration in time range)  fentaNYL (SUBLIMAZE) 250 MCG/5ML injection (has no administration in time range)  midazolam (VERSED) 2 MG/2ML injection (has no administration in time range)  bupivacaine-EPINEPHrine (MARCAINE W/ EPI) 0.5% -1:200000 (with pres) injection (has no administration in time range)  dexamethasone (DECADRON) 10 MG/ML injection (has no administration in time range)  ondansetron (ZOFRAN) 4 MG/2ML injection (has no administration in time range)  sugammadex sodium (BRIDION) 500 MG/5ML injection (has no administration in time range)     Initial Impression / Assessment and Plan / ED Course  I have reviewed the triage vital signs and the nursing notes.  Pertinent labs & imaging results that were available during my care of the patient were reviewed by me and considered in my medical decision making (see chart for details).       Tachycardic, tachypneic, and in distress on exam.  Exam very concerning for Fournier's gangrene.  Immediately initiated sepsis protocol with vancomycin, ceftriaxone, clindamycin, IV fluid bolus, and blood cultures.  Immediately contacted general surgery and discussed with  PA for Dr. Luisa Hart.  They saw the patient in the ED and will take to the OR but requested medicine  admission due to hyperglycemia.  Patient's lab work was notable for WBC 14, normal lactate, initial CMP with bicarb 16, potassium 3.3, normal creatinine, anion gap 19, glucose 353.  Venous pH is within normal limits.  I have held off on starting insulin drip but have instead ordered 2.5L IVF and will recheck BMP.  COVID-19 test negative.  Consulted urology and discussed with Dr. Berneice HeinrichManny at the request of general surgery.  CT of abdomen pelvis does confirm diagnosis of Fournier's gangrene, with subcutaneous emphysema and edema in the groin.  Discussed admission with family medicine teaching service and patient admitted for further treatment.  Final Clinical Impressions(s) / ED Diagnoses   Final diagnoses:  None    ED Discharge Orders    None       Aleesha Ringstad, Ambrose Finlandachel Morgan, MD 06/10/19 1600

## 2019-06-10 NOTE — Anesthesia Procedure Notes (Signed)
Procedure Name: Intubation Date/Time: 06/10/2019 2:25 PM Performed by: Candis Shine, CRNA Pre-anesthesia Checklist: Patient identified, Emergency Drugs available, Suction available and Patient being monitored Patient Re-evaluated:Patient Re-evaluated prior to induction Oxygen Delivery Method: Circle System Utilized Preoxygenation: Pre-oxygenation with 100% oxygen Induction Type: IV induction Ventilation: Mask ventilation without difficulty and Oral airway inserted - appropriate to patient size Laryngoscope Size: Mac and 4 Grade View: Grade I Tube type: Oral Tube size: 7.5 mm Number of attempts: 1 Airway Equipment and Method: Stylet and Oral airway Placement Confirmation: ETT inserted through vocal cords under direct vision,  positive ETCO2 and breath sounds checked- equal and bilateral Secured at: 22 cm Tube secured with: Tape Dental Injury: Teeth and Oropharynx as per pre-operative assessment

## 2019-06-10 NOTE — Progress Notes (Signed)
Pharmacy Antibiotic Note  Mario Little is a 43 y.o. male admitted on 06/10/2019 with sepsis and cellulitis. Pt recently s/p I&D and started on doxy 91/50 for folliculitis and abscess of L groin, returns today with worsening pain. Pharmacy has been consulted for vancomycin dosing. Also with 1x doses of Clinda and ceftriaxone ordered per EDP. Afebrile, WBC 14, LA 1.6. SCr 1.1 on admit.  Plan: Vancomycin 2g IV x 1; then Vancomycin 1500 mg IV Q 12 hrs. Goal AUC 400-550. Expected AUC: 466 SCr used: 1.1 Ceftriaxone 2g IV x 1 + Clinda 900mg  IV x 1 per EDP - f/u if to continue Monitor clinical progress, c/s, renal function F/u de-escalation plan/LOT, vancomycin levels as indicated  ADDENDUM: Pharmacy consulted to add Zosyn. Continuing on vancomycin.  Plan: Zosyn 3.375g IV (67min infusion) x1; then 3.375g IV q8h (4h infusion) Monitor clinical progress, c/s, renal function F/u de-escalation plan/LOT, vancomycin levels as indicated    Height: 6\' 1"  (185.4 cm) Weight: (!) 325 lb (147.4 kg) IBW/kg (Calculated) : 79.9  Temp (24hrs), Avg:99 F (37.2 C), Min:99 F (37.2 C), Max:99 F (37.2 C)  Recent Labs  Lab 06/10/19 0823 06/10/19 0900  WBC 14.0*  --   CREATININE 1.10 0.70  LATICACIDVEN 1.6  --     Estimated Creatinine Clearance: 183.7 mL/min (by C-G formula based on SCr of 0.7 mg/dL).    No Known Allergies  Elicia Lamp, PharmD, BCPS Please check AMION for all [facility] contact numbers Clinical Pharmacist 06/10/2019 11:26 AM

## 2019-06-10 NOTE — Op Note (Addendum)
Preoperative diagnosis: 10 cm suprapubic subcutaneous  abscess complex  Postop diagnosis: Same  Procedure: Incision and drainage of 10 cm suprapubic abscess  Surgeon: Erroll Luna, MD  Co-surgeon: Dr. Phebe Colla, MD  Anesthesia: General  EBL: 70 cc  Drains: None  Specimen: Cultures to pathology  Indications for procedure: Patient presents for debridement of a large subcutaneous abscess in his suprapubic area.  He presents with pain and drainage from the suprapubic region.  CT scan shows a complex infection in the subcutaneous tissues overlying the pubic symphysis.  He also had penile edema and urology was consulted for evaluation of this.  He requires debridement for significant infection and abscess.  We discussed the procedure as well as risks and benefits.  Urology was planning on cystoscopy as well and evaluation of penile shaft and scrotum.The procedure has been discussed with the patient.  Alternative therapies have been discussed with the patient.  Operative risks include bleeding,  Infection,  Organ injury,  Nerve injury,  Blood vessel injury,  DVT,  Pulmonary embolism,  Death,  And possible reoperation.  Medical management risks include worsening of present situation.  The success of the procedure is 50 -90 % at treating patients symptoms.  The patient understands and agrees to proceed.   Description of procedure: The patient was met in the holding area.  He agreed to proceed.  He was placed on the operating table.  He was placed supine.  After induction of general esthesia the lower region of the pubis and scrotum and upper thighs were prepped and draped in a sterile fashion and timeout was performed.  Proper patient procedure verified.  Dr. Bess Harvest performed cystoscopy.  Please see his op note for details of this.  Once he was done, a transverse incision was made over the pubic symphysis.  Copious amounts of foul-smelling pus were encountered and the incision was extended down toward  his left hemiscrotum and then up toward the left inguinal crease.  I used my finger to open up the loculations and widely open this.  Cultures were taken.  This measured roughly 10 cm x 10 cm.  It did not involve the muscle or fascia below it and appeared to be all in the subcutaneous tissues.  Cautery was used for hemostasis.  The wound was packed with saline soaked gauze after ensuring hemostasis and dry dressings applied.  Of note this did not extend down the penis nor into the scrotal sac.  After dressings were placed the patient was awoke extubated taken to recovery in satisfactory condition.  All final counts were found to be correct.

## 2019-06-10 NOTE — Transfer of Care (Signed)
Immediate Anesthesia Transfer of Care Note  Patient: Mario Little  Procedure(s) Performed: CYSTOSCOPY INCISION AND DRAINAGE SUPRAPUBIS ABSCESS (N/A )  Patient Location: PACU  Anesthesia Type:General  Level of Consciousness: awake, alert  and oriented  Airway & Oxygen Therapy: Patient Spontanous Breathing and Patient connected to face mask oxygen  Post-op Assessment: Report given to RN and Post -op Vital signs reviewed and stable  Post vital signs: Reviewed and stable  Last Vitals:  Vitals Value Taken Time  BP    Temp    Pulse    Resp    SpO2      Last Pain:  Vitals:   06/10/19 1225  TempSrc:   PainSc: 6       Patients Stated Pain Goal: 2 (08/81/10 3159)  Complications: No apparent anesthesia complications

## 2019-06-10 NOTE — Brief Op Note (Signed)
06/10/2019  2:42 PM  PATIENT:  Mario Little  43 y.o. male  PRE-OPERATIVE DIAGNOSIS:  Suprapubic Soft Tissue Infection, Severe penile edema  POST-OPERATIVE DIAGNOSIS:  same  PROCEDURE:   Cystoscopy   SURGEON:  Surgeon(s) and Role:     [name], MD  PHYSICIAN ASSISTANT:   ASSISTANTS: none   ANESTHESIA:   general  EBL:  minimal   BLOOD ADMINISTERED:none  DRAINS: wound packing   LOCAL MEDICATIONS USED:  NONE  SPECIMEN:  Source of Specimen:  suprapubic soft tissue infection  DISPOSITION OF SPECIMEN:  microbilogy  COUNTS:  YES  TOURNIQUET:  * No tourniquets in log *  DICTATION: .Other Dictation: Dictation Number [id number]  PLAN OF CARE: remain in OR  PATIENT DISPOSITION:  remain in OR then medical admission   Delay start of Pharmacological VTE agent (>24hrs) due to surgical blood loss or risk of bleeding: yes

## 2019-06-10 NOTE — Consult Note (Signed)
Reason for Consult: Suprapubic Soft Tissue Infection, Penile Edema  Referring Physician: Harriette Bouillon MD  Mario Little is an 43 y.o. male.   HPI:   1 - Suprapubic Soft Tissue Infection - about 5 days of worseneing pain / erythema of left groin / suprapubid area. Had boil lanced few dasy ago and placed on doxy but symptoms progressed. WBC 19k. CT with some soft tissue edema / minimal gas. Exam c/w severe cellulitis / early abscess. NO CX data to guide therapy. CBG's 350s c/w diabetes, denies previously. Placed on empiric vanc/zosyn.   2 -  Penile Edema - uncircumcised with new penile edema in setting of above infection. No penile skin erythema / crepitus / fluctuence. NO voiding compliants.   PMH sig for foot surgery, lumbago (neurosurg eval pending), morbid obesity, DM2 (new DX clinically).  Today "Mario Little" (his middle name that he prefers) is seen for evaluation of above. He is udnergoing planend I+d by general surgery later today who have asked for our assistance / opinion.   Past Medical History:  Diagnosis Date  . Bipolar 1 disorder (HCC)   . Chronic back pain   . Complication of anesthesia    Hard to put to sleep  . Hypertension   . Intermittent explosive disorder   . Multiple personality (HCC)   . PTSD (post-traumatic stress disorder)     Past Surgical History:  Procedure Laterality Date  . FRACTURE SURGERY      Family History  Family history unknown: Yes    Social History:  reports that he has been smoking cigarettes. He has been smoking about 0.50 packs per day. He has never used smokeless tobacco. He reports current alcohol use. He reports that he does not use drugs.  Allergies: No Known Allergies  Medications: I have reviewed the patient's current medications.  Results for orders placed or performed during the hospital encounter of 06/10/19 (from the past 48 hour(s))  Comprehensive metabolic panel     Status: Abnormal   Collection Time: 06/10/19  8:23 AM   Result Value Ref Range   Sodium 135 135 - 145 mmol/L   Potassium 3.3 (L) 3.5 - 5.1 mmol/L   Chloride 100 98 - 111 mmol/L   CO2 16 (L) 22 - 32 mmol/L   Glucose, Bld 353 (H) 70 - 99 mg/dL   BUN 5 (L) 6 - 20 mg/dL   Creatinine, Ser 6.22 0.61 - 1.24 mg/dL   Calcium 9.0 8.9 - 29.7 mg/dL   Total Protein 6.7 6.5 - 8.1 g/dL   Albumin 2.6 (L) 3.5 - 5.0 g/dL   AST 14 (L) 15 - 41 U/L   ALT 18 0 - 44 U/L   Alkaline Phosphatase 114 38 - 126 U/L   Total Bilirubin 1.3 (H) 0.3 - 1.2 mg/dL   GFR calc non Af Amer >60 >60 mL/min   GFR calc Af Amer >60 >60 mL/min   Anion gap 19 (H) 5 - 15    Comment: Performed at [facility], [address] [place], [place] [address]  CBC with Differential     Status: Abnormal   Collection Time: 06/10/19  8:23 AM  Result Value Ref Range   WBC 14.0 (H) 4.0 - 10.5 K/uL   RBC 4.88 4.22 - 5.81 MIL/uL   Hemoglobin 12.8 (L) 13.0 - 17.0 g/dL   HCT 19.4 17.4 - 08.1 %   MCV 81.8 80.0 - 100.0 fL   MCH 26.2 26.0 - 34.0 pg  MCHC 32.1 30.0 - 36.0 g/dL   RDW 40.9 81.1 - 91.4 %   Platelets 257 150 - 400 K/uL   nRBC 0.0 0.0 - 0.2 %   Neutrophils Relative % 74 %   Neutro Abs 10.4 (H) 1.7 - 7.7 K/uL   Lymphocytes Relative 11 %   Lymphs Abs 1.5 0.7 - 4.0 K/uL   Monocytes Relative 8 %   Monocytes Absolute 1.1 (H) 0.1 - 1.0 K/uL   Eosinophils Relative 2 %   Eosinophils Absolute 0.3 0.0 - 0.5 K/uL   Basophils Relative 1 %   Basophils Absolute 0.1 0.0 - 0.1 K/uL   Immature Granulocytes 4 %   Abs Immature Granulocytes 0.58 (H) 0.00 - 0.07 K/uL    Comment: Performed at [facility], [address], [place], [place] [address]  Lactic acid, plasma     Status: None   Collection Time: 06/10/19  8:23 AM  Result Value Ref Range   Lactic Acid, Venous 1.6 0.5 - 1.9 mmol/L    Comment: Performed at [facility], [address], [place], [place] [address]  Urinalysis, Routine w reflex microscopic     Status: Abnormal   Collection Time: 06/10/19  8:23 AM  Result  Value Ref Range   Color, Urine YELLOW YELLOW   APPearance CLEAR CLEAR   Specific Gravity, Urine 1.031 (H) 1.005 - 1.030   pH 5.0 5.0 - 8.0   Glucose, UA >=500 (A) NEGATIVE mg/dL   Hgb urine dipstick SMALL (A) NEGATIVE   Bilirubin Urine NEGATIVE NEGATIVE   Ketones, ur 80 (A) NEGATIVE mg/dL   Protein, ur 30 (A) NEGATIVE mg/dL   Nitrite NEGATIVE NEGATIVE   Leukocytes,Ua NEGATIVE NEGATIVE   WBC, UA 0-5 0 - 5 WBC/hpf   Bacteria, UA NONE SEEN NONE SEEN   Squamous Epithelial / LPF 0-5 0 - 5    Comment: Performed at [facility], [address], [place], [place] [address]  I-stat chem 8, ED (not at [facility] or [facility])     Status: Abnormal   Collection Time: 06/10/19  9:00 AM  Result Value Ref Range   Sodium 134 (L) 135 - 145 mmol/L   Potassium 5.5 (H) 3.5 - 5.1 mmol/L   Chloride 104 98 - 111 mmol/L   BUN 8 6 - 20 mg/dL   Creatinine, Ser 4.69 0.61 - 1.24 mg/dL   Glucose, Bld 629 (H) 70 - 99 mg/dL   Calcium, Ion 5.28 (L) 1.15 - 1.40 mmol/L   TCO2 19 (L) 22 - 32 mmol/L   Hemoglobin 12.9 (L) 13.0 - 17.0 g/dL   HCT 41.3 (L) 24.4 - 01.0 %  SARS Coronavirus 2 by RT PCR (hospital order, performed in [facility] hospital lab) Nasopharyngeal Nasopharyngeal Swab     Status: None   Collection Time: 06/10/19  9:01 AM   Specimen: Nasopharyngeal Swab  Result Value Ref Range   SARS Coronavirus 2 NEGATIVE NEGATIVE    Comment: (NOTE) If result is NEGATIVE SARS-CoV-2 target nucleic acids are NOT DETECTED. The SARS-CoV-2 RNA is generally detectable in upper and lower  respiratory specimens during the acute phase of infection. The lowest  concentration of SARS-CoV-2 viral copies this assay can detect is 250  copies / mL. A negative result does not preclude SARS-CoV-2 infection  and should not be used as the sole basis for treatment or other  patient management decisions.  A negative result may occur with  improper specimen collection / handling, submission of  specimen other  than nasopharyngeal swab,  presence of viral mutation(s) within the  areas targeted by this assay, and inadequate number of viral copies  (<250 copies / mL). A negative result must be combined with clinical  observations, patient history, and epidemiological information. If result is POSITIVE SARS-CoV-2 target nucleic acids are DETECTED. The SARS-CoV-2 RNA is generally detectable in upper and lower  respiratory specimens dur ing the acute phase of infection.  Positive  results are indicative of active infection with SARS-CoV-2.  Clinical  correlation with patient history and other diagnostic information is  necessary to determine patient infection status.  Positive results do  not rule out bacterial infection or co-infection with other viruses. If result is PRESUMPTIVE POSTIVE SARS-CoV-2 nucleic acids MAY BE PRESENT.   A presumptive positive result was obtained on the submitted specimen  and confirmed on repeat testing.  While 2019 novel coronavirus  (SARS-CoV-2) nucleic acids may be present in the submitted sample  additional confirmatory testing may be necessary for epidemiological  and / or clinical management purposes  to differentiate between  SARS-CoV-2 and other Sarbecovirus currently known to infect humans.  If clinically indicated additional testing with an alternate test  methodology [phone]) is advised. The SARS-CoV-2 RNA is generally  detectable in upper and lower respiratory sp ecimens during the acute  phase of infection. The expected result is Negative. Fact Sheet for Patients:  BoilerBrush.com.cy Fact Sheet for Healthcare Providers: https://pope.com/ This test is not yet approved or cleared by the [place] FDA and has been authorized for detection and/or diagnosis of SARS-CoV-2 by FDA under an Emergency Use Authorization (EUA).  This EUA will remain in effect (meaning this test can be used) for the duration of the COVID-19 declaration under  Section 564(b)(1) of the Act, 21 U.S.C. section 360bbb-3(b)(1), unless the authorization is terminated or revoked sooner. Performed at [facility], [address], [place], [place] [address]   POCT I-Stat EG7     Status: Abnormal   Collection Time: 06/10/19  9:54 AM  Result Value Ref Range   pH, Ven 7.388 7.250 - 7.430   pCO2, Ven 31.2 (L) 44.0 - 60.0 mmHg   pO2, Ven 119.0 (H) 32.0 - 45.0 mmHg   Bicarbonate 18.8 (L) 20.0 - 28.0 mmol/L   TCO2 20 (L) 22 - 32 mmol/L   O2 Saturation 99.0 %   Acid-base deficit 5.0 (H) 0.0 - 2.0 mmol/L   Sodium 134 (L) 135 - 145 mmol/L   Potassium 5.5 (H) 3.5 - 5.1 mmol/L   Calcium, Ion 1.12 (L) 1.15 - 1.40 mmol/L   HCT 38.0 (L) 39.0 - 52.0 %   Hemoglobin 12.9 (L) 13.0 - 17.0 g/dL   Patient temperature HIDE    Sample type VENOUS   Basic metabolic panel     Status: Abnormal   Collection Time: 06/10/19 11:25 AM  Result Value Ref Range   Sodium 136 135 - 145 mmol/L   Potassium 3.2 (L) 3.5 - 5.1 mmol/L    Comment: DELTA CHECK NOTED   Chloride 104 98 - 111 mmol/L   CO2 16 (L) 22 - 32 mmol/L   Glucose, Bld 294 (H) 70 - 99 mg/dL   BUN 6 6 - 20 mg/dL   Creatinine, Ser 1.47 0.61 - 1.24 mg/dL   Calcium 8.4 (L) 8.9 - 10.3 mg/dL   GFR calc non Af Amer >60 >60 mL/min   GFR calc Af Amer >60 >60 mL/min   Anion gap 16 (H) 5 -  15    Comment: Performed at [facility], [address] [place], [place] [address]    Ct Abdomen Pelvis W Contrast  Result Date: 06/10/2019 CLINICAL DATA:  Clinical concern for groin infection and Fournier gangrene. EXAM: CT ABDOMEN AND PELVIS WITH CONTRAST TECHNIQUE: Multidetector CT imaging of the abdomen and pelvis was performed using the standard protocol following bolus administration of intravenous contrast. CONTRAST:  100mL OMNIPAQUE IOHEXOL 300 MG/ML  SOLN COMPARISON:  None. FINDINGS: Lower chest: There is mild right basilar atelectasis. Hepatobiliary: No focal liver abnormality is seen. No gallstones, gallbladder  wall thickening, or biliary dilatation. Pancreas: Unremarkable. No pancreatic ductal dilatation or surrounding inflammatory changes. Spleen: Normal in size without focal abnormality. Adrenals/Urinary Tract: A left adrenal mass measures 2.1 cm and measures less than 10 Hounsfield units, consistent with a benign adrenal adenoma or myelolipoma. Kidneys are normal, without renal calculi, focal lesion, or hydronephrosis. Bladder is unremarkable. Stomach/Bowel: Stomach is within normal limits. Appendix appears normal. No evidence of bowel wall thickening, distention, or inflammatory changes. Vascular/Lymphatic: No significant vascular findings are present. No enlarged abdominal lymph nodes. Reproductive: Prostate is unremarkable. Other: There is soft tissue edema and disorganized fluid with subcutaneous emphysema and overlying skin thickening involving the groin, concerning for Fournier's gangrene. The penis and scrotum are incompletely imaged on this exam. Multiple enlarged bilateral inguinal and iliac chain lymph nodes are likely reactive. There is no involvement of the perianal region or retroperitoneal/intraperitoneal extension. There is no well-defined rim enhancing fluid collection to suggest abscess. No abdominal wall hernia. No abdominopelvic ascites. Musculoskeletal: No focal osseous demineralization to suggest osteomyelitis. Degenerative changes are seen in both hips and the lumbar spine. IMPRESSION: Soft tissue edema with subcutaneous emphysema and overlying skin thickening involving the groin is consistent with Fournier gangrene. Of note, the penis and scrotum are incompletely imaged. No well-defined fluid collection to suggest abscess. No involvement of the perianal region. No evidence of retroperitoneal or intraperitoneal extension. Bilateral inguinal and iliac chain lymph nodes are likely reactive. Electronically Signed   By: Romona Curlsyler  Litton M.D.   On: 06/10/2019 10:56    Review of Systems   Constitutional: Positive for chills, fever and malaise/fatigue.  HENT: Negative.   Eyes: Negative.   Respiratory: Negative.   Cardiovascular: Negative.   Gastrointestinal: Negative.  Negative for nausea and vomiting.  Genitourinary: Negative for frequency, hematuria and urgency.  Musculoskeletal: Negative.   Skin: Negative.   Neurological: Negative.   Endo/Heme/Allergies: Negative.   Psychiatric/Behavioral: Negative.    Blood pressure 129/66, pulse 95, temperature 99 F (37.2 C), temperature source Oral, resp. rate 12, height 6\' 1"  (1.854 m), weight (!) 147.4 kg, SpO2 97 %. Physical Exam  Constitutional: He appears well-developed.  Morbidly obese. Pleasant and cooperative.   HENT:  Head: Normocephalic.  Neck: Normal range of motion.  Cardiovascular: Normal rate.  Respiratory: Effort normal.  GI: Soft.  Large truncal obesity.   Genitourinary:    Genitourinary Comments: Erytehma / weeping are about 10x20cm suprapubic fat pad with some purulent drainge via prior left goin incsion. NO crepeitus. Uncircumcised with soft penile shaft and foreskin edema.    Musculoskeletal: Normal range of motion.  Neurological: He is alert.  Skin: Skin is warm.  Psychiatric: He has a normal mood and affect.    Assessment/Plan:  1 - Suprapubic Soft Tissue Infection - this does not appear necrotizing at this time or directly involving penis / scrotum other than by adjacent edema. Agree with I+D / subQ driain placement /  tissue CX, and IV ABX with glycemic control as best management. I will plan to perform cysto and exam under anesthesia to max r/o direct GU infovlement of need for GU debridment.   2 -  Penile Edema - appears secondary to above and will improve as that infectious process improves.   [name] 06/10/2019, 12:32 PM

## 2019-06-10 NOTE — H&P (Addendum)
Family Medicine Teaching [facility] Admission History and Physical Service Pager: [phone]  Patient name: Mario Little Medical record number: [id number] Date of birth: 30-Apr-1976 Age: 43 y.o. Gender: male  Primary Care Provider: Patient, No Pcp Per Consultants: Gen surg Code Status: Full  Chief Complaint: Groin pain/ penile swelling  Assessment and Plan: Mario Little is a 43 y.o. male presenting with groin pain and penile swelling. PMH is significant for chronic back pain, bipolar 1, hypertension.  Fournier's gangrene-meeting sepsis criteria Patient with new-onset diabetes who presented with swelling and erythema of penis, scrotum and inguinal region after an I&D for furuncle. CT scan showed soft tissue edema with subcutaneous emphysema and overlying skin thickening involving the greater groin which was consistent with Fournier gangrene, without evidence of abscess.  In the ED the patient was afebrile but had periods of tachycardia and tachypnea, as well as an elevated white blood cell count of 14.  Lactate was within normal limits at 1.6.  Patient also received a urinalysis which was negative for signs of infection, however it did show a glucose of greater than 500, suggesting type 2 diabetes along with his presenting blood sugar of 336, which can be a predisposing factor. -Admit to MedSurg, attending Dr. Leveda Anna -General surgery and urology on board, appreciate recommendation -IV morphine for pain and MiraLAX daily -N.p.o. until surgery -Vancomycin, Zosyn as antibiotic regimen for Fournier's gangrene -PT/OT -A.m. CBC/BMP -Monitor lactic acid -Follow-up on urine and blood cultures -Glucose management as below  New onset-type 2 diabetes Blood glucose on presentation of 336.  Patient without history of type 2 diabetes.  Currently on no medications for this.  Patient does have a history of type 2 diabetes in his father.  Urinalysis on presentation was positive for glucose of  greater than 500. -Initiate Lantus 15 units daily -Sensitive sliding scale -On discharge will discuss oral medications -Morning A1c -We will check TSH in the morning -Lipid panel in the morning -A.m. BMP  Chronic back pain Home medications include Percocet 5-325 every 6 hours as needed, Zanaflex 4 mg as needed, gabapentin 600 mg 3 times daily -Continue home gabapentin -Continue home Zanaflex as needed -Hold home Percocet due to IV morphine dosing for groin pain.  Patient reported he is psych patient: Currently on no home medications.  Unable to find further information via chart review.  No known home medications. -Continue to monitor  FEN/GI: N.p.o. Prophylaxis: SCDs  Disposition: Admit to MedSurg  History of Present Illness:  Mario Little is a 43 y.o. male presenting with groin and penile swelling.  Patient has had several days of groin swelling for which he visited urgent care few days ago and believes he was diagnosed with a furuncle at this time.  He was started on doxycycline and had an I&D performed.  He states that after this the swelling and pain got worse and started having drainage.  This morning he woke up in a lot of pain and noticed a drastic increase in his swelling prompting him to come to the emergency department.  Patient states he only drinks alcohol on weekends when with friends and at these times he may drink approximately a fifth of liquor.  He states he typically does not drink during the week.  Patient smokes half a pack per day.  Patient also admits to smoking marijuana every day.  No other illicit drugs.  In the ED, surgery was immediately consulted for concern for Fournier's gangrene.  Surgery recommended debridement in  the OR in addition to initiation of antibiotics.  Pain medicine teaching service was consulted for management of his diabetes and antibiotics.  Review Of Systems: Per HPI with the following additions:   Review of Systems   Constitutional: Negative for chills and fever.  HENT: Negative for hearing loss.   Eyes: Negative for blurred vision.  Respiratory: Negative for shortness of breath.   Cardiovascular: Negative for chest pain.  Gastrointestinal: Positive for diarrhea. Negative for abdominal pain.  Genitourinary: Positive for frequency. Negative for dysuria.  Musculoskeletal: Positive for back pain.  Neurological: Negative for headaches.    Patient Active Problem List   Diagnosis Date Noted  . Fournier's gangrene 06/10/2019    Past Medical History: Past Medical History:  Diagnosis Date  . Bipolar 1 disorder (HCC)   . Chronic back pain   . Complication of anesthesia    Hard to put to sleep  . Hypertension   . Intermittent explosive disorder   . Multiple personality (HCC)   . PTSD (post-traumatic stress disorder)     Past Surgical History: Past Surgical History:  Procedure Laterality Date  . FRACTURE SURGERY      Social History: Social History   Tobacco Use  . Smoking status: Current Every Day Smoker    Packs/day: 0.50    Types: Cigarettes  . Smokeless tobacco: Never Used  Substance Use Topics  . Alcohol use: Yes    Comment: occ  . Drug use: Never   Additional social history: None Please also refer to relevant sections of EMR.  Family History: Family History  Family history unknown: Yes    Allergies and Medications: No Known Allergies No current facility-administered medications on file prior to encounter.    Current Outpatient Medications on File Prior to Encounter  Medication Sig Dispense Refill  . doxycycline (VIBRAMYCIN) 100 MG capsule Take 1 capsule (100 mg total) by mouth 2 (two) times daily. 20 capsule 0  . gabapentin (NEURONTIN) 300 MG capsule Take 2 capsules (600 mg total) by mouth 3 (three) times daily. 180 capsule 2  . ibuprofen (ADVIL) 800 MG tablet Take 1 tablet (800 mg total) by mouth every 8 (eight) hours as needed. 90 tablet 1  . oxyCODONE-acetaminophen  (PERCOCET/ROXICET) 5-325 MG tablet Take 1 tablet by mouth every 6 (six) hours as needed for severe pain. (Patient not taking: Reported on 06/10/2019) 20 tablet 0  . tiZANidine (ZANAFLEX) 4 MG tablet Take 1 tablet (4 mg total) by mouth every 8 (eight) hours as needed. 90 tablet 1    Objective: BP 129/66   Pulse 95   Temp 99 F (37.2 C) (Oral)   Resp 12   Ht 6\' 1"  (1.854 m)   Wt (!) 147.4 kg   SpO2 97%   BMI 42.88 kg/m  Exam: General: Alert and oriented, appears in some pain Eyes: PERRLA ENTM: No pharyengeal erythema, mouth appears dry Cardiovascular: RRR with no murmurs noted Respiratory: CTA bilaterally  Gastrointestinal: Bowel sounds present. No abdominal pain GU: Patient with impressive swelling in penis, scrotum, inguinal region with exquisite tenderness to palpation.  Erythema encompassing his entire groin extending up his mons pubis to the fold beneath his pannus and extending laterally and down to his perineum.  Evidence of multiple inflamed follicles.  Exquisitely tender to palpation and manipulation.  Tenderness and swelling of the scrotum and penis observed with pitting edema noted in the scrotum and penis.  No appreciable swelling of the glans. Psych: Behavior and speech appropriate to situation  Labs  and Imaging: CBC BMET  Recent Labs  Lab 06/10/19 0823  06/10/19 0954  WBC 14.0*  --   --   HGB 12.8*   < > 12.9*  HCT 39.9   < > 38.0*  PLT 257  --   --    < > = values in this interval not displayed.   Recent Labs  Lab 06/10/19 1125  NA 136  K 3.2*  CL 104  CO2 16*  BUN 6  CREATININE 0.91  GLUCOSE 294*  CALCIUM 8.4*     Mr Thoracic Spine Wo Contrast  Result Date: 05/25/2019 CLINICAL DATA:  Mid back pain EXAM: MRI THORACIC SPINE WITHOUT CONTRAST TECHNIQUE: Multiplanar, multisequence MR imaging of the thoracic spine was performed. No intravenous contrast was administered. COMPARISON:  X-ray 04/30/2019.  Lumbar spine MRI 04/27/2019 FINDINGS: Technical note:  Motion degraded examination, particularly affecting the axial sequences. Alignment:  Physiologic. Vertebrae: No fracture, evidence of discitis, or bone lesion. Cord: No abnormal cord signal is evident within the limitations of this exam. Paraspinal and other soft tissues: Negative. Disc levels: T1-T2: Sagittal sequences only. At least mild bilateral foraminal stenosis. No canal stenosis evident. T2-T3: Disc osteophyte complex resulting in moderate left and mild right foraminal stenosis with mild canal stenosis. T3-T4: Disc osteophyte complex results in mild canal stenosis without foraminal stenosis. T4-T5: Disc osteophyte complex results in mild canal stenosis without foraminal stenosis. T5-T6: Disc osteophyte complex results in mild canal stenosis without foraminal stenosis. T6-T7: Prominent disc osteophyte complex resulting in moderate canal stenosis without significant foraminal stenosis. T7-T8: Disc osteophyte complex results in mild canal stenosis without foraminal stenosis. T8-T9: Left paracentral disc osteophyte complex results in mild left foraminal stenosis and mild canal stenosis. T9-T10: Disc osteophyte complex resulting in mild right greater than left foraminal stenosis with minimal canal stenosis. T10-T11: Disc osteophyte complex resulting in mild bilateral foraminal stenosis without canal stenosis. T11-T12: Diffuse disc osteophyte complex and mild bilateral facet arthrosis results in severe right and moderate left foraminal stenosis without canal stenosis. IMPRESSION: 1. Extensive thoracic spondylosis with multiple levels of canal stenosis, most pronounced at T6-7 where a prominent disc osteophyte complex results in moderate canal stenosis. 2. Multiple levels of bilateral foraminal stenosis, most pronounced at T11-12 where there is severe right and moderate left foraminal stenosis. Electronically Signed   By: Duanne Guess M.D.   On: 05/25/2019 11:23   Ct Abdomen Pelvis W Contrast  Result Date:  06/10/2019 CLINICAL DATA:  Clinical concern for groin infection and Fournier gangrene. EXAM: CT ABDOMEN AND PELVIS WITH CONTRAST TECHNIQUE: Multidetector CT imaging of the abdomen and pelvis was performed using the standard protocol following bolus administration of intravenous contrast. CONTRAST:  OMNIPAQUE IOHEXOL 300 MG/ML  SOLN COMPARISON:  None. FINDINGS: Lower chest: There is mild right basilar atelectasis. Hepatobiliary: No focal liver abnormality is seen. No gallstones, gallbladder wall thickening, or biliary dilatation. Pancreas: Unremarkable. No pancreatic ductal dilatation or surrounding inflammatory changes. Spleen: Normal in size without focal abnormality. Adrenals/Urinary Tract: A left adrenal mass measures 2.1 cm and measures less than 10 Hounsfield units, consistent with a benign adrenal adenoma or myelolipoma. Kidneys are normal, without renal calculi, focal lesion, or hydronephrosis. Bladder is unremarkable. Stomach/Bowel: Stomach is within normal limits. Appendix appears normal. No evidence of bowel wall thickening, distention, or inflammatory changes. Vascular/Lymphatic: No significant vascular findings are present. No enlarged abdominal lymph nodes. Reproductive: Prostate is unremarkable. Other: There is soft tissue edema and disorganized fluid with subcutaneous emphysema and overlying skin thickening  involving the groin, concerning for Fournier's gangrene. The penis and scrotum are incompletely imaged on this exam. Multiple enlarged bilateral inguinal and iliac chain lymph nodes are likely reactive. There is no involvement of the perianal region or retroperitoneal/intraperitoneal extension. There is no well-defined rim enhancing fluid collection to suggest abscess. No abdominal wall hernia. No abdominopelvic ascites. Musculoskeletal: No focal osseous demineralization to suggest osteomyelitis. Degenerative changes are seen in both hips and the lumbar spine. IMPRESSION: Soft tissue edema  with subcutaneous emphysema and overlying skin thickening involving the groin is consistent with Fournier gangrene. Of note, the penis and scrotum are incompletely imaged. No well-defined fluid collection to suggest abscess. No involvement of the perianal region. No evidence of retroperitoneal or intraperitoneal extension. Bilateral inguinal and iliac chain lymph nodes are likely reactive. Electronically Signed   By: Zerita Boers M.D.   On: 06/10/2019 10:56     Lurline Del, DO 06/10/2019, 12:46 PM PGY-1, [facility] pager: [phone], text pages welcome  FPTS Upper-Level Resident Addendum   I have independently interviewed and examined the patient. I have discussed the above with the original author and agree with their documentation. My edits for correction/addition/clarification are in blue. Please see also any attending notes.    Matilde Haymaker MD PGY-2, [facility] 06/10/2019 8:05 PM  FPTS Service pager: [phone] (text pages welcome through [facility])

## 2019-06-10 NOTE — Anesthesia Postprocedure Evaluation (Signed)
Anesthesia Post Note  Patient: Osualdo N Fouty  Procedure(s) Performed: CYSTOSCOPY INCISION AND DRAINAGE OF SUPRAPUBIC ABSCESS (N/A )     Patient location during evaluation: PACU Anesthesia Type: General Level of consciousness: awake and patient cooperative Pain management: pain level controlled Vital Signs Assessment: post-procedure vital signs reviewed and stable Respiratory status: spontaneous breathing, nonlabored ventilation, respiratory function stable and patient connected to nasal cannula oxygen Cardiovascular status: blood pressure returned to baseline and stable Postop Assessment: no apparent nausea or vomiting Anesthetic complications: no    Last Vitals:  Vitals:   06/10/19 1545 06/10/19 1606  BP: 135/72 137/70  Pulse: (!) 114 (!) 110  Resp: 18 18  Temp: 37.2 C 37.5 C  SpO2: 94% 95%    Last Pain:  Vitals:   06/10/19 1606  TempSrc: Oral  PainSc: Asleep                 Naryah Clenney

## 2019-06-10 NOTE — ED Notes (Signed)
Patient transported to CT 

## 2019-06-11 ENCOUNTER — Encounter (HOSPITAL_COMMUNITY): Payer: Self-pay | Admitting: Surgery

## 2019-06-11 DIAGNOSIS — E1165 Type 2 diabetes mellitus with hyperglycemia: Secondary | ICD-10-CM | POA: Diagnosis not present

## 2019-06-11 DIAGNOSIS — R739 Hyperglycemia, unspecified: Secondary | ICD-10-CM

## 2019-06-11 DIAGNOSIS — N493 Fournier gangrene: Secondary | ICD-10-CM | POA: Diagnosis not present

## 2019-06-11 LAB — BASIC METABOLIC PANEL
Anion gap: 15 (ref 5–15)
BUN: <5 mg/dL — ABNORMAL LOW (ref 6–20)
CO2: 18 mmol/L — ABNORMAL LOW (ref 22–32)
Calcium: 8.3 mg/dL — ABNORMAL LOW (ref 8.9–10.3)
Chloride: 104 mmol/L (ref 98–111)
Creatinine, Ser: 0.89 mg/dL (ref 0.61–1.24)
GFR calc Af Amer: >60 mL/min (ref >60)
GFR calc non Af Amer: >60 mL/min (ref >60)
Glucose, Bld: 300 mg/dL — ABNORMAL HIGH (ref 70–99)
Potassium: 3.8 mmol/L (ref 3.5–5.1)
Sodium: 137 mmol/L (ref 135–145)

## 2019-06-11 LAB — CBC
HCT: 35.6 % — ABNORMAL LOW (ref 39.0–52.0)
Hemoglobin: 11.6 g/dL — ABNORMAL LOW (ref 13.0–17.0)
MCH: 26.2 pg (ref 26.0–34.0)
MCHC: 32.6 g/dL (ref 30.0–36.0)
MCV: 80.5 fL (ref 80.0–100.0)
Platelets: 244 10*3/uL (ref 150–400)
RBC: 4.42 MIL/uL (ref 4.22–5.81)
RDW: 14.6 % (ref 11.5–15.5)
WBC: 13.3 10*3/uL — ABNORMAL HIGH (ref 4.0–10.5)
nRBC: 0 % (ref 0.0–0.2)

## 2019-06-11 LAB — GLUCOSE, CAPILLARY
Glucose-Capillary: 295 mg/dL — ABNORMAL HIGH (ref 70–99)
Glucose-Capillary: 306 mg/dL — ABNORMAL HIGH (ref 70–99)
Glucose-Capillary: 239 mg/dL — ABNORMAL HIGH (ref 70–99)
Glucose-Capillary: 228 mg/dL — ABNORMAL HIGH (ref 70–99)
Glucose-Capillary: 236 mg/dL — ABNORMAL HIGH (ref 70–99)

## 2019-06-11 LAB — URINE CULTURE

## 2019-06-11 LAB — TSH: TSH: 0.108 u[IU]/mL — ABNORMAL LOW (ref 0.350–4.500)

## 2019-06-11 LAB — LIPID PANEL
Cholesterol: 148 mg/dL (ref 0–200)
HDL: 22 mg/dL — ABNORMAL LOW (ref >40)
LDL Cholesterol: 104 mg/dL — ABNORMAL HIGH (ref 0–99)
Total CHOL/HDL Ratio: 6.7 RATIO
Triglycerides: 109 mg/dL (ref <150)
VLDL: 22 mg/dL (ref 0–40)

## 2019-06-11 MED ORDER — INSULIN GLARGINE 100 UNIT/ML ~~LOC~~ SOLN
20.0000 [IU] | Freq: Every day | SUBCUTANEOUS | Status: DC
  Filled 2019-06-11: qty 0.2

## 2019-06-11 MED ORDER — LIVING WELL WITH DIABETES BOOK
Freq: Once | Status: AC
  Administered 2019-06-11: 12:00:00
  Filled 2019-06-11: qty 1

## 2019-06-11 MED ORDER — OXYCODONE HCL 5 MG PO TABS
5.0000 mg | ORAL_TABLET | ORAL | Status: DC | PRN
  Administered 2019-06-11 – 2019-06-15 (×11): 10 mg via ORAL
  Filled 2019-06-11 (×12): qty 2

## 2019-06-11 MED ORDER — HEPARIN SODIUM (PORCINE) 5000 UNIT/ML IJ SOLN
5000.0000 [IU] | Freq: Three times a day (TID) | INTRAMUSCULAR | Status: DC
  Administered 2019-06-11 – 2019-06-18 (×20): 5000 [IU] via SUBCUTANEOUS
  Filled 2019-06-11 (×21): qty 1

## 2019-06-11 MED ORDER — INSULIN STARTER KIT- PEN NEEDLES (ENGLISH)
1.0000 | Freq: Once | Status: DC
  Filled 2019-06-11: qty 1

## 2019-06-11 MED ORDER — INSULIN GLARGINE 100 UNIT/ML ~~LOC~~ SOLN
20.0000 [IU] | Freq: Every day | SUBCUTANEOUS | Status: DC
  Administered 2019-06-11 – 2019-06-13 (×3): 20 [IU] via SUBCUTANEOUS
  Filled 2019-06-11 (×3): qty 0.2

## 2019-06-11 MED ORDER — HYDROMORPHONE HCL 1 MG/ML IJ SOLN
1.0000 mg | INTRAMUSCULAR | Status: AC | PRN
  Administered 2019-06-11 – 2019-06-12 (×2): 1 mg via INTRAVENOUS
  Filled 2019-06-11 (×4): qty 1

## 2019-06-11 MED ORDER — HYDROMORPHONE HCL 1 MG/ML IJ SOLN: 1.0000 mg | INTRAMUSCULAR | Status: DC | PRN

## 2019-06-11 NOTE — Op Note (Signed)
Mario Little, NOAH MEDICAL RECORD [id number] ACCOUNT [id number] DATE OF BIRTH:18-Dec-1975 FACILITY: [facility] LOCATION: [facility] PHYSICIAN:[name], MD  OPERATIVE REPORT  DATE OF PROCEDURE:  06/10/2019  PREOPERATIVE DIAGNOSIS:  Severe suprapubic soft tissue infection with penile edema.  PROCEDURE: 1.  Cystoscopy. 2.  Incision and drainage of suprapubic area by Dr. [name], separate procedure.  SURGEON:  [name], MD  ESTIMATED BLOOD LOSS:  Nil.  COMPLICATIONS:  None.  SPECIMENS:  None.  FINDINGS: 1.  Significant penile edema that is soft and appears uninvolved directly with a suprapubic infection. 2.  Unremarkable urethra and urinary bladder by cystoscopy.  INDICATIONS:  The patient is a 43 year old man with history of morbid obesity and clinically diagnosed new diabetes, who was found to have a left inguinal small abscess for slightly less than a week.  He underwent a lancing of the area a few days ago;  however, he has worsened in terms of local infection and systemic parameters.  He presented to the emergency room today and on further evaluation, he was found to have essentially impending necrotizing infection and it was clearly felt that urgent I and  D was warranted by our general surgery colleagues.  He does have severe penile edema as well.  On exam, he does not have any scrotal involvement.  He does not have any crepitus or  fluctuance of the penis.  It is somewhat unclear whether this is  edema only versus continuation of his infection.  So, it was felt that combined procedure with incision and drainage and cystoscopy would be warranted.  Informed consent was then placed in the medical record.  DESCRIPTION OF PROCEDURE:  The patient was identified.  Procedure being cystoscopy and soft tissue debridement of his suprapubic area was confirmed.  Procedure timeout was performed.  Intravenous antibiotics administered.  General anesthesia induced.   The patient was  placed in the supine position.  Sterile field was created by prepping and draping his penis, perineum, proximal thighs and infraumbilical abdomen using iodine.  Attention was directed to cystourethroscopy.  This was performed using a  16-French flexible cystoscope.  Anterior and posterior urethra unremarkable.  Inspection of the urinary bladder with no diverticula, calcifications, papillary lesions.  Ureteral orifices were singleton.  Retroflexion showed no additional findings.   Cystoscope was removed and no additional findings noted.  Incision and drainage was then performed as per separate dictated operative note by Dr. [name].  Via the I and D incision site, I palpated the area towards the area of the penis and again, soft  tissue infection did not directly involve this area and the edema again appeared to be soft and secondary to local inflammation only.  This was quite favorable and this did not require formal debridement of his penis or scrotum.  At this point, the  urologic portion of the procedure was terminated.  The patient was turned over completely to the general surgery service for the remainder of his procedure today.  VN/NUANCE  D:06/10/2019 T:06/10/2019 JOB:[id number]

## 2019-06-11 NOTE — Progress Notes (Signed)
Inpatient Diabetes Program Recommendations  AACE/ADA: New Consensus Statement on Inpatient Glycemic Control (2015)  Target Ranges:  Prepandial:   less than 140 mg/dL      Peak postprandial:   less than 180 mg/dL (1-2 hours)      Critically ill patients:  140 - 180 mg/dL   Lab Results  Component Value Date   GLUCAP 306 (H) 06/11/2019   HGBA1C 16.8 (H) 06/11/2019    Review of Glycemic Control Results for ANH, MANGANO (MRN [id number]) as of 06/11/2019 15:03  Ref. Range 06/10/2019 17:44 06/10/2019 22:40 06/11/2019 08:20 06/11/2019 12:13  Glucose-Capillary Latest Ref Range: 70 - 99 mg/dL 218 (H) 245 (H) 295 (H) 306 (H)   Diabetes history: New onset DM Outpatient Diabetes medications: none Current orders for Inpatient glycemic control: Novolog 0-9 units TID, Novolog 0-5 units QHS, Lantus 20 units QD Decadron 5 mg x 1  Inpatient Diabetes Program Recommendations:    Consider the following:  -Increasing Lantus to 15 units BID -Novolog 0-20 units TID -Adding Novolog 4 units TID (assuming patient is consuming >50% of meal).   Spoke with patient regarding new onset diabetes. Patient experienced symptoms of polyuria and thirst.  Reviewed patient's current A1c of 16.8%. Explained what a A1c is and what it measures. Also reviewed goal A1c with patient, importance of good glucose control @ home, and blood sugar goals. Reviewed patho of DM, need for insulin, role of pancreas, current inpatient trends and insulin needs, vascular changes, survival skills, interventions, impact of glycemic control and infection, and other co-morbidities. Patient will need a meter at discharge. Blood glucose meter kit (includes lancets and strips) ( #[id number]). Encouraged to begin learning how to check CBGs while inpatient and reviewed frequency when discharged. Stressed the importance of following up with PCP and taking values with him to the visit.  Patient admits to drinking large amounts of juices daily and  consumes larges amounts of carbohydrates. Reviewed plate method, carb counting, and discussed alternatives to current choices. Briefly discussed importance of increasing activity nad reviewed ADA guidelines for weekly activity and benefit to health outcomes. Dietitian consult placed.  Even with diet changes and addition of oral agents, would anticipate need for insulin at discharge. Patient to begin working on self injections while inpatient.  Educated patient on insulin pen use at home. Reviewed contents of insulin flexpen starter kit. Reviewed all steps if insulin pen including attachment of needle, 2-unit air shot, dialing up dose, giving injection, removing needle, disposal of sharps, storage of unused insulin, disposal of insulin etc. Patient able to provide successful return demonstration. Also reviewed troubleshooting with insulin pen. MD to give patient Rxs for insulin pens and insulin pen needles. LWWDM booklet and insulin starter kit ordered. Will also consult CM for PCP follow up. Will need to follow back up with patient to review survival skills and ensure understanding of main concepts.   Thanks, Bronson Curb, MSN, RNC-OB Diabetes Coordinator [phone] (8a-5p)

## 2019-06-11 NOTE — Progress Notes (Signed)
Family Medicine Teaching Service Daily Progress Note Intern Pager: [phone]  Patient name: Mario Little [place] Medical record number: [id number] Date of birth: [birth year] Age: 43 y.o. Gender: male  Primary Care Provider: Patient, No Pcp Per Consultants: General surgery, urology Code Status: Full  Pt Overview and Major Events to Date:  10/25-patient admitted 10/25-I&D and cystoscopy  Assessment and Plan: Mario Little is a 43 y.o. male presenting with groin pain and penile swelling. PMH is significant for chronic back pain, bipolar 1, hypertension.  Groin Abscess vs Fournier's Gangrene-meeting sepsis criteria Status post surgical I&D. WBC 13.3.  CT scan showed soft tissue edema with subcutaneous emphysema and overlying skin thickening involving the greater groin which was consistent with Fournier gangrene, without evidence of abscess.  In the ED the patient was afebrile but had periods of tachycardia and tachypnea, as well as an elevated white blood cell count of 14. -General surgery and urology on board, appreciate recommendation  -Per speaking with surgery due to the purulence it seems this is more consistent with a groin abscess rather than Fournier's gangrene.  At this time surgery does not expect to take patient back for any further surgical procedures. -IV morphine for pain and MiraLAX daily -Vancomycin, Zosyn as antibiotic regimen for ?Fournier's gangrene -PT/OT -A.m. CBC/BMP -Follow-up on urine and blood cultures -Glucose management as below  New onset-type 2 diabetes Morning blood sugar 245.  TSH decreased 0.108. Blood glucose on presentation of 336.  Patient without history of type 2 diabetes.  Currently on no medications for this.  Patient does have a history of type 2 diabetes in his father.  Urinalysis on presentation was positive for glucose of greater than 500. -Initiate Lantus 20units daily -Sensitive sliding scale -On discharge will discuss oral  medications -Morning A1c -We will check TSH in the morning -Lipid panel in the morning -A.m. BMP  Chronic back pain Home medications include Percocet 5-325 every 6 hours as needed, Zanaflex 4 mg as needed, gabapentin 600 mg 3 times daily -Continue home gabapentin -Continue home Zanaflex as needed -Hold home Percocet due to IV morphine dosing for groin pain.  Patient reported he is psych patient: Currently on no home medications.    Not on any home medications.  Unable to find further information via chart review.   -Continue to monitor  FEN/GI: Carb modified PPx: Heparin subq  Disposition: Likely home pending clinical work-up.  Subjective:  Patient states his pain is improved after having his I&D yesterday.  States that he is still in pain and rates his pain at a 7 out of 10 where upon presentation yesterday he believes his pain was around a 9 out of 10.  Patient still with swelling in his scrotum and penis as expected at this point.  Discussed with patient the importance of controlling his blood sugars at this point is during his hospitalization we have discovered that he is a diabetic.  Patient seems highly motivated to get his blood sugars under control.  Objective: Temp:  [98.1 F (36.7 C)-99.5 F (37.5 C)] 98.3 F (36.8 C) (10/26 0558) Pulse Rate:  [68-121] 74 (10/26 0558) Resp:  [10-26] 16 (10/26 0558) BP: (95-156)/(41-97) 123/74 (10/26 0558) SpO2:  [90 %-100 %] 100 % (10/26 0558) Weight:  [147.4 kg] 147.4 kg (10/25 0739) Physical Exam: General: Alert and oriented in no apparent distress Heart: Regular rate and rhythm with no murmurs appreciated Lungs: CTA bilaterally, no wheezing Abdomen: Bowel sounds present, no abdominal pain GU: Surgical bandage in place  on lower abdomen, penis and scrotum with swelling similar to yesterday, penis/scrotum only mildly tender, likely related to swelling at this point.  Laboratory: Recent Labs  Lab 06/10/19 0823 06/10/19 0900  06/10/19 0954 06/11/19 0248  WBC 14.0*  --   --  13.3*  HGB 12.8* 12.9* 12.9* 11.6*  HCT 39.9 38.0* 38.0* 35.6*  PLT 257  --   --  244   Recent Labs  Lab 06/10/19 0823 06/10/19 0900 06/10/19 0954 06/10/19 1125 06/11/19 0248  NA 135 134* 134* 136 137  K 3.3* 5.5* 5.5* 3.2* 3.8  CL 100 104  --  104 104  CO2 16*  --   --  16* 18*  BUN 5* 8  --  6 <5*  CREATININE 1.10 0.70  --  0.91 0.89  CALCIUM 9.0  --   --  8.4* 8.3*  PROT 6.7  --   --   --   --   BILITOT 1.3*  --   --   --   --   ALKPHOS 114  --   --   --   --   ALT 18  --   --   --   --   AST 14*  --   --   --   --   GLUCOSE 353* 336*  --  294* 300*    Mr Thoracic Spine Wo Contrast  Result Date: 05/25/2019 CLINICAL DATA:  Mid back pain EXAM: MRI THORACIC SPINE WITHOUT CONTRAST TECHNIQUE: Multiplanar, multisequence MR imaging of the thoracic spine was performed. No intravenous contrast was administered. COMPARISON:  X-ray 04/30/2019.  Lumbar spine MRI 04/27/2019 FINDINGS: Technical note: Motion degraded examination, particularly affecting the axial sequences. Alignment:  Physiologic. Vertebrae: No fracture, evidence of discitis, or bone lesion. Cord: No abnormal cord signal is evident within the limitations of this exam. Paraspinal and other soft tissues: Negative. Disc levels: T1-T2: Sagittal sequences only. At least mild bilateral foraminal stenosis. No canal stenosis evident. T2-T3: Disc osteophyte complex resulting in moderate left and mild right foraminal stenosis with mild canal stenosis. T3-T4: Disc osteophyte complex results in mild canal stenosis without foraminal stenosis. T4-T5: Disc osteophyte complex results in mild canal stenosis without foraminal stenosis. T5-T6: Disc osteophyte complex results in mild canal stenosis without foraminal stenosis. T6-T7: Prominent disc osteophyte complex resulting in moderate canal stenosis without significant foraminal stenosis. T7-T8: Disc osteophyte complex results in mild canal  stenosis without foraminal stenosis. T8-T9: Left paracentral disc osteophyte complex results in mild left foraminal stenosis and mild canal stenosis. T9-T10: Disc osteophyte complex resulting in mild right greater than left foraminal stenosis with minimal canal stenosis. T10-T11: Disc osteophyte complex resulting in mild bilateral foraminal stenosis without canal stenosis. T11-T12: Diffuse disc osteophyte complex and mild bilateral facet arthrosis results in severe right and moderate left foraminal stenosis without canal stenosis. IMPRESSION: 1. Extensive thoracic spondylosis with multiple levels of canal stenosis, most pronounced at T6-7 where a prominent disc osteophyte complex results in moderate canal stenosis. 2. Multiple levels of bilateral foraminal stenosis, most pronounced at T11-12 where there is severe right and moderate left foraminal stenosis. Electronically Signed   By: Duanne Guess M.D.   On: 05/25/2019 11:23   Ct Abdomen Pelvis W Contrast  Result Date: 06/10/2019 CLINICAL DATA:  Clinical concern for groin infection and Fournier gangrene. EXAM: CT ABDOMEN AND PELVIS WITH CONTRAST TECHNIQUE: Multidetector CT imaging of the abdomen and pelvis was performed using the standard protocol following bolus administration of intravenous contrast. CONTRAST:  142mL OMNIPAQUE IOHEXOL 300 MG/ML  SOLN COMPARISON:  None. FINDINGS: Lower chest: There is mild right basilar atelectasis. Hepatobiliary: No focal liver abnormality is seen. No gallstones, gallbladder wall thickening, or biliary dilatation. Pancreas: Unremarkable. No pancreatic ductal dilatation or surrounding inflammatory changes. Spleen: Normal in size without focal abnormality. Adrenals/Urinary Tract: A left adrenal mass measures 2.1 cm and measures less than 10 Hounsfield units, consistent with a benign adrenal adenoma or myelolipoma. Kidneys are normal, without renal calculi, focal lesion, or hydronephrosis. Bladder is unremarkable.  Stomach/Bowel: Stomach is within normal limits. Appendix appears normal. No evidence of bowel wall thickening, distention, or inflammatory changes. Vascular/Lymphatic: No significant vascular findings are present. No enlarged abdominal lymph nodes. Reproductive: Prostate is unremarkable. Other: There is soft tissue edema and disorganized fluid with subcutaneous emphysema and overlying skin thickening involving the groin, concerning for Fournier's gangrene. The penis and scrotum are incompletely imaged on this exam. Multiple enlarged bilateral inguinal and iliac chain lymph nodes are likely reactive. There is no involvement of the perianal region or retroperitoneal/intraperitoneal extension. There is no well-defined rim enhancing fluid collection to suggest abscess. No abdominal wall hernia. No abdominopelvic ascites. Musculoskeletal: No focal osseous demineralization to suggest osteomyelitis. Degenerative changes are seen in both hips and the lumbar spine. IMPRESSION: Soft tissue edema with subcutaneous emphysema and overlying skin thickening involving the groin is consistent with Fournier gangrene. Of note, the penis and scrotum are incompletely imaged. No well-defined fluid collection to suggest abscess. No involvement of the perianal region. No evidence of retroperitoneal or intraperitoneal extension. Bilateral inguinal and iliac chain lymph nodes are likely reactive. Electronically Signed   By: Zerita Boers M.D.   On: 06/10/2019 10:56     Lurline Del, DO 06/11/2019, 6:18 AM PGY-1, [place] Intern pager: [phone], text pages welcome

## 2019-06-11 NOTE — Progress Notes (Addendum)
[facility] Progress Note  1 Day Post-Op  Subjective: CC-  Complaining of severe pain. Nervous about first dressing change.  Objective: Vital signs in last 24 hours: Temp:  [98.1 F (36.7 C)-99.5 F (37.5 C)] 98.3 F (36.8 C) (10/26 0558) Pulse Rate:  [68-121] 74 (10/26 0558) Resp:  [12-23] 16 (10/26 0558) BP: (95-156)/(41-97) 123/74 (10/26 0558) SpO2:  [90 %-100 %] 100 % (10/26 0558) Last BM Date: (PTA)  Intake/Output from previous day: 10/25 0701 - 10/26 0700 In: 4780 [P.O.:330; I.V.:600; IV Piggyback:3850] Out: 1530 [Urine:1510; Blood:20] Intake/Output this shift: No intake/output data recorded.  PE: Gen:  Alert, NAD Pulm:  Rate and effort normal Abd: Soft, obese, NT/ND GU:      Lab Results:  Recent Labs    06/10/19 0823  06/10/19 0954 06/11/19 0248  WBC 14.0*  --   --  13.3*  HGB 12.8*   < > 12.9* 11.6*  HCT 39.9   < > 38.0* 35.6*  PLT 257  --   --  244   < > = values in this interval not displayed.   BMET Recent Labs    06/10/19 1125 06/11/19 0248  NA 136 137  K 3.2* 3.8  CL 104 104  CO2 16* 18*  GLUCOSE 294* 300*  BUN 6 <5*  CREATININE 0.91 0.89  CALCIUM 8.4* 8.3*   PT/INR No results for input(s): LABPROT, INR in the last 72 hours. CMP     Component Value Date/Time   NA 137 06/11/2019 0248   K 3.8 06/11/2019 0248   CL 104 06/11/2019 0248   CO2 18 (L) 06/11/2019 0248   GLUCOSE 300 (H) 06/11/2019 0248   BUN <5 (L) 06/11/2019 0248   CREATININE 0.89 06/11/2019 0248   CALCIUM 8.3 (L) 06/11/2019 0248   PROT 6.7 06/10/2019 0823   ALBUMIN 2.6 (L) 06/10/2019 0823   AST 14 (L) 06/10/2019 0823   ALT 18 06/10/2019 0823   ALKPHOS 114 06/10/2019 0823   BILITOT 1.3 (H) 06/10/2019 0823   GFRNONAA >60 06/11/2019 0248   GFRAA >60 06/11/2019 0248   Lipase  No results found for: LIPASE     Studies/Results: Ct Abdomen Pelvis W Contrast  Result Date: 06/10/2019 CLINICAL DATA:  Clinical concern for groin infection and Fournier  gangrene. EXAM: CT ABDOMEN AND PELVIS WITH CONTRAST TECHNIQUE: Multidetector CT imaging of the abdomen and pelvis was performed using the standard protocol following bolus administration of intravenous contrast. CONTRAST:  163mL OMNIPAQUE IOHEXOL 300 MG/ML  SOLN COMPARISON:  None. FINDINGS: Lower chest: There is mild right basilar atelectasis. Hepatobiliary: No focal liver abnormality is seen. No gallstones, gallbladder wall thickening, or biliary dilatation. Pancreas: Unremarkable. No pancreatic ductal dilatation or surrounding inflammatory changes. Spleen: Normal in size without focal abnormality. Adrenals/Urinary Tract: A left adrenal mass measures 2.1 cm and measures less than 10 Hounsfield units, consistent with a benign adrenal adenoma or myelolipoma. Kidneys are normal, without renal calculi, focal lesion, or hydronephrosis. Bladder is unremarkable. Stomach/Bowel: Stomach is within normal limits. Appendix appears normal. No evidence of bowel wall thickening, distention, or inflammatory changes. Vascular/Lymphatic: No significant vascular findings are present. No enlarged abdominal lymph nodes. Reproductive: Prostate is unremarkable. Other: There is soft tissue edema and disorganized fluid with subcutaneous emphysema and overlying skin thickening involving the groin, concerning for Fournier's gangrene. The penis and scrotum are incompletely imaged on this exam. Multiple enlarged bilateral inguinal and iliac chain lymph nodes are likely reactive. There is no involvement of the perianal region or  retroperitoneal/intraperitoneal extension. There is no well-defined rim enhancing fluid collection to suggest abscess. No abdominal wall hernia. No abdominopelvic ascites. Musculoskeletal: No focal osseous demineralization to suggest osteomyelitis. Degenerative changes are seen in both hips and the lumbar spine. IMPRESSION: Soft tissue edema with subcutaneous emphysema and overlying skin thickening involving the groin  is consistent with Fournier gangrene. Of note, the penis and scrotum are incompletely imaged. No well-defined fluid collection to suggest abscess. No involvement of the perianal region. No evidence of retroperitoneal or intraperitoneal extension. Bilateral inguinal and iliac chain lymph nodes are likely reactive. Electronically Signed   By: Romona Curls M.D.   On: 06/10/2019 10:56    Anti-infectives: Anti-infectives (From admission, onward)   Start     Dose/Rate Route Frequency Ordered Stop   06/10/19 2200  vancomycin (VANCOCIN) 1,500 mg in sodium chloride 0.9 % 500 mL IVPB     1,500 mg 250 mL/hr over 120 Minutes Intravenous Every 12 hours 06/10/19 0913     06/10/19 1800  piperacillin-tazobactam (ZOSYN) IVPB 3.375 g     3.375 g 12.5 mL/hr over 240 Minutes Intravenous Every 8 hours 06/10/19 1128     06/10/19 1130  piperacillin-tazobactam (ZOSYN) IVPB 3.375 g     3.375 g 100 mL/hr over 30 Minutes Intravenous  Once 06/10/19 1128 06/10/19 1154   06/10/19 0845  vancomycin (VANCOCIN) IVPB 1000 mg/200 mL premix  Status:  Discontinued     1,000 mg 200 mL/hr over 60 Minutes Intravenous  Once 06/10/19 0833 06/10/19 0836   06/10/19 0845  cefTRIAXone (ROCEPHIN) 2 g in sodium chloride 0.9 % 100 mL IVPB     2 g 200 mL/hr over 30 Minutes Intravenous  Once 06/10/19 0833 06/10/19 0926   06/10/19 0845  clindamycin (CLEOCIN) IVPB 900 mg     900 mg 100 mL/hr over 30 Minutes Intravenous  Once 06/10/19 0834 06/10/19 0926   06/10/19 0845  vancomycin (VANCOCIN) 2,000 mg in sodium chloride 0.9 % 500 mL IVPB     2,000 mg 250 mL/hr over 120 Minutes Intravenous  Once 06/10/19 0836 06/10/19 1153       Assessment/Plan DM Chronic back pain  10 cm suprapubic subcutaneous  abscess complex S/p Incision and drainage of 10 cm suprapubic abscess 10/25 Dr. Luisa Hart cystoscopy per Dr. Berneice Heinrich - POD#1 - cultures pending - BID wet to dry dressing changes. Shower with wounds open. Continue IV antibiotics and follow  culture. Home health ordered for assistance with wound care.  ID - zosyn/vancomycin 10/25>> FEN - CM diet VTE - SCDs, sq heparin   LOS: 1 day    Franne Forts, [facility] 06/11/2019, 9:45 AM Please see Amion for pager number during day hours 7:00am-4:30pm

## 2019-06-12 ENCOUNTER — Encounter (HOSPITAL_COMMUNITY): Payer: Self-pay | Admitting: General Practice

## 2019-06-12 DIAGNOSIS — N493 Fournier gangrene: Secondary | ICD-10-CM | POA: Diagnosis not present

## 2019-06-12 DIAGNOSIS — E11628 Type 2 diabetes mellitus with other skin complications: Secondary | ICD-10-CM

## 2019-06-12 DIAGNOSIS — L02214 Cutaneous abscess of groin: Secondary | ICD-10-CM | POA: Diagnosis present

## 2019-06-12 DIAGNOSIS — R739 Hyperglycemia, unspecified: Secondary | ICD-10-CM | POA: Diagnosis not present

## 2019-06-12 DIAGNOSIS — L0291 Cutaneous abscess, unspecified: Secondary | ICD-10-CM

## 2019-06-12 DIAGNOSIS — E1165 Type 2 diabetes mellitus with hyperglycemia: Secondary | ICD-10-CM | POA: Diagnosis present

## 2019-06-12 LAB — GLUCOSE, CAPILLARY
Glucose-Capillary: 203 mg/dL — ABNORMAL HIGH (ref 70–99)
Glucose-Capillary: 158 mg/dL — ABNORMAL HIGH (ref 70–99)
Glucose-Capillary: 161 mg/dL — ABNORMAL HIGH (ref 70–99)
Glucose-Capillary: 178 mg/dL — ABNORMAL HIGH (ref 70–99)
Glucose-Capillary: 279 mg/dL — ABNORMAL HIGH (ref 70–99)

## 2019-06-12 LAB — CBC
HCT: 35.7 % — ABNORMAL LOW (ref 39.0–52.0)
Hemoglobin: 11.6 g/dL — ABNORMAL LOW (ref 13.0–17.0)
MCH: 26.1 pg (ref 26.0–34.0)
MCHC: 32.5 g/dL (ref 30.0–36.0)
MCV: 80.4 fL (ref 80.0–100.0)
Platelets: 270 10*3/uL (ref 150–400)
RBC: 4.44 MIL/uL (ref 4.22–5.81)
RDW: 14.5 % (ref 11.5–15.5)
WBC: 12.2 10*3/uL — ABNORMAL HIGH (ref 4.0–10.5)
nRBC: 0 % (ref 0.0–0.2)

## 2019-06-12 LAB — HEMOGLOBIN A1C
Hgb A1c MFr Bld: >15.5 % — ABNORMAL HIGH (ref 4.8–5.6)
Mean Plasma Glucose: >398 mg/dL

## 2019-06-12 LAB — BASIC METABOLIC PANEL
Anion gap: 11 (ref 5–15)
BUN: 7 mg/dL (ref 6–20)
CO2: 24 mmol/L (ref 22–32)
Calcium: 8.1 mg/dL — ABNORMAL LOW (ref 8.9–10.3)
Chloride: 101 mmol/L (ref 98–111)
Creatinine, Ser: 0.82 mg/dL (ref 0.61–1.24)
GFR calc Af Amer: >60 mL/min (ref >60)
GFR calc non Af Amer: >60 mL/min (ref >60)
Glucose, Bld: 222 mg/dL — ABNORMAL HIGH (ref 70–99)
Potassium: 3.1 mmol/L — ABNORMAL LOW (ref 3.5–5.1)
Sodium: 136 mmol/L (ref 135–145)

## 2019-06-12 LAB — MRSA PCR SCREENING: MRSA by PCR: POSITIVE — AB

## 2019-06-12 MED ORDER — MUPIROCIN 2 % EX OINT
1.0000 "application " | TOPICAL_OINTMENT | Freq: Two times a day (BID) | CUTANEOUS | Status: AC
  Administered 2019-06-12 – 2019-06-17 (×10): 1 via NASAL
  Filled 2019-06-12 (×3): qty 22

## 2019-06-12 MED ORDER — ALBUTEROL SULFATE HFA 108 (90 BASE) MCG/ACT IN AERS
INHALATION_SPRAY | RESPIRATORY_TRACT | Status: AC
  Filled 2019-06-12: qty 6.7

## 2019-06-12 MED ORDER — MIDAZOLAM HCL 2 MG/2ML IJ SOLN
INTRAMUSCULAR | Status: AC
  Filled 2019-06-12: qty 2

## 2019-06-12 MED ORDER — FENTANYL CITRATE (PF) 250 MCG/5ML IJ SOLN
INTRAMUSCULAR | Status: AC
  Filled 2019-06-12: qty 5

## 2019-06-12 MED ORDER — POTASSIUM CHLORIDE 10 MEQ/100ML IV SOLN: 10.0000 meq | INTRAVENOUS | Status: DC

## 2019-06-12 MED ORDER — POTASSIUM CHLORIDE CRYS ER 20 MEQ PO TBCR
40.0000 meq | EXTENDED_RELEASE_TABLET | Freq: Once | ORAL | Status: AC
  Administered 2019-06-12: 40 meq via ORAL
  Filled 2019-06-12: qty 2

## 2019-06-12 MED ORDER — ENSURE MAX PROTEIN PO LIQD
11.0000 [oz_av] | Freq: Two times a day (BID) | ORAL | Status: DC
  Administered 2019-06-13 – 2019-06-18 (×10): 11 [oz_av] via ORAL
  Filled 2019-06-12 (×12): qty 330

## 2019-06-12 MED ORDER — HYDROMORPHONE HCL 1 MG/ML IJ SOLN: 1.0000 mg | INTRAMUSCULAR | Status: DC | PRN

## 2019-06-12 MED ORDER — PROPOFOL 10 MG/ML IV BOLUS
INTRAVENOUS | Status: AC
  Filled 2019-06-12: qty 40

## 2019-06-12 MED ORDER — HYDROMORPHONE HCL 1 MG/ML IJ SOLN
0.5000 mg | INTRAMUSCULAR | Status: DC | PRN
  Administered 2019-06-13: 2 mg via INTRAVENOUS
  Administered 2019-06-13 (×2): 1 mg via INTRAVENOUS
  Administered 2019-06-13 – 2019-06-14 (×3): 2 mg via INTRAVENOUS
  Administered 2019-06-14: 1 mg via INTRAVENOUS
  Administered 2019-06-14 – 2019-06-15 (×3): 2 mg via INTRAVENOUS
  Filled 2019-06-12 (×3): qty 2
  Filled 2019-06-12: qty 1
  Filled 2019-06-12 (×2): qty 2
  Filled 2019-06-12 (×2): qty 1
  Filled 2019-06-12 (×2): qty 2
  Filled 2019-06-12 (×2): qty 1

## 2019-06-12 MED ORDER — HYDROMORPHONE HCL 1 MG/ML IJ SOLN
1.0000 mg | INTRAMUSCULAR | Status: DC | PRN
  Administered 2019-06-12: 1 mg via INTRAVENOUS

## 2019-06-12 MED ORDER — ADULT MULTIVITAMIN W/MINERALS CH
1.0000 | ORAL_TABLET | Freq: Every day | ORAL | Status: DC
  Administered 2019-06-13 – 2019-06-18 (×6): 1 via ORAL
  Filled 2019-06-12 (×6): qty 1

## 2019-06-12 MED ORDER — CHLORHEXIDINE GLUCONATE CLOTH 2 % EX PADS
6.0000 | MEDICATED_PAD | Freq: Every day | CUTANEOUS | Status: AC
  Administered 2019-06-13 – 2019-06-17 (×4): 6 via TOPICAL

## 2019-06-12 NOTE — Progress Notes (Signed)
Patient ID: Denman George, male   DOB: [birth year], 43 y.o.   MRN: [id number]    2 Days Post-Op  Subjective: Patient still with a lot of pain.  Noticed some increased swelling on the right side of his mons.  Objective: Vital signs in last 24 hours: Temp:  [98.5 F (36.9 C)-99.4 F (37.4 C)] 98.9 F (37.2 C) (10/27 0435) Pulse Rate:  [70-84] 70 (10/27 0435) Resp:  [16-19] 16 (10/27 0435) BP: (123-144)/(69-79) 123/72 (10/27 0435) SpO2:  [96 %-100 %] 96 % (10/27 0435) Last BM Date: (PTA)  Intake/Output from previous day: 10/26 0701 - 10/27 0700 In: 2134.8 [P.O.:897; IV Piggyback:1237.8] Out: 800 [Urine:800] Intake/Output this shift: No intake/output data recorded.  PE: Skin: wound has a fair amount of purulent drainage throughout the base of the wound.  He is so exquisitely tender so difficult to get a great exam, but does seem to finger fracture some in the right lateral portion of the wound.  Right mons, inguinal area with induration and cellulitic changes.  Some erythema tracking down right thigh.     Lab Results:  Recent Labs    06/11/19 0248 06/12/19 0723  WBC 13.3* 12.2*  HGB 11.6* 11.6*  HCT 35.6* 35.7*  PLT 244 270   BMET Recent Labs    06/11/19 0248 06/12/19 0723  NA 137 136  K 3.8 3.1*  CL 104 101  CO2 18* 24  GLUCOSE 300* 222*  BUN <5* 7  CREATININE 0.89 0.82  CALCIUM 8.3* 8.1*   PT/INR No results for input(s): LABPROT, INR in the last 72 hours. CMP     Component Value Date/Time   NA 136 06/12/2019 0723   K 3.1 (L) 06/12/2019 0723   CL 101 06/12/2019 0723   CO2 24 06/12/2019 0723   GLUCOSE 222 (H) 06/12/2019 0723   BUN 7 06/12/2019 0723   CREATININE 0.82 06/12/2019 0723   CALCIUM 8.1 (L) 06/12/2019 0723   PROT 6.7 06/10/2019 0823   ALBUMIN 2.6 (L) 06/10/2019 0823   AST 14 (L) 06/10/2019 0823   ALT 18 06/10/2019 0823   ALKPHOS 114 06/10/2019 0823   BILITOT 1.3 (H) 06/10/2019 0823   GFRNONAA >60 06/12/2019 0723   GFRAA >60 06/12/2019  0723   Lipase  No results found for: LIPASE     Studies/Results: Ct Abdomen Pelvis W Contrast  Result Date: 06/10/2019 CLINICAL DATA:  Clinical concern for groin infection and Fournier gangrene. EXAM: CT ABDOMEN AND PELVIS WITH CONTRAST TECHNIQUE: Multidetector CT imaging of the abdomen and pelvis was performed using the standard protocol following bolus administration of intravenous contrast. CONTRAST:  141mL OMNIPAQUE IOHEXOL 300 MG/ML  SOLN COMPARISON:  None. FINDINGS: Lower chest: There is mild right basilar atelectasis. Hepatobiliary: No focal liver abnormality is seen. No gallstones, gallbladder wall thickening, or biliary dilatation. Pancreas: Unremarkable. No pancreatic ductal dilatation or surrounding inflammatory changes. Spleen: Normal in size without focal abnormality. Adrenals/Urinary Tract: A left adrenal mass measures 2.1 cm and measures less than 10 Hounsfield units, consistent with a benign adrenal adenoma or myelolipoma. Kidneys are normal, without renal calculi, focal lesion, or hydronephrosis. Bladder is unremarkable. Stomach/Bowel: Stomach is within normal limits. Appendix appears normal. No evidence of bowel wall thickening, distention, or inflammatory changes. Vascular/Lymphatic: No significant vascular findings are present. No enlarged abdominal lymph nodes. Reproductive: Prostate is unremarkable. Other: There is soft tissue edema and disorganized fluid with subcutaneous emphysema and overlying skin thickening involving the groin, concerning for Fournier's gangrene. The penis and scrotum are  incompletely imaged on this exam. Multiple enlarged bilateral inguinal and iliac chain lymph nodes are likely reactive. There is no involvement of the perianal region or retroperitoneal/intraperitoneal extension. There is no well-defined rim enhancing fluid collection to suggest abscess. No abdominal wall hernia. No abdominopelvic ascites. Musculoskeletal: No focal osseous demineralization to  suggest osteomyelitis. Degenerative changes are seen in both hips and the lumbar spine. IMPRESSION: Soft tissue edema with subcutaneous emphysema and overlying skin thickening involving the groin is consistent with Fournier gangrene. Of note, the penis and scrotum are incompletely imaged. No well-defined fluid collection to suggest abscess. No involvement of the perianal region. No evidence of retroperitoneal or intraperitoneal extension. Bilateral inguinal and iliac chain lymph nodes are likely reactive. Electronically Signed   By: Romona Curls M.D.   On: 06/10/2019 10:56    Anti-infectives: Anti-infectives (From admission, onward)   Start     Dose/Rate Route Frequency Ordered Stop   06/10/19 2200  vancomycin (VANCOCIN) 1,500 mg in sodium chloride 0.9 % 500 mL IVPB     1,500 mg 250 mL/hr over 120 Minutes Intravenous Every 12 hours 06/10/19 0913     06/10/19 1800  piperacillin-tazobactam (ZOSYN) IVPB 3.375 g     3.375 g 12.5 mL/hr over 240 Minutes Intravenous Every 8 hours 06/10/19 1128     06/10/19 1130  piperacillin-tazobactam (ZOSYN) IVPB 3.375 g     3.375 g 100 mL/hr over 30 Minutes Intravenous  Once 06/10/19 1128 06/10/19 1154   06/10/19 0845  vancomycin (VANCOCIN) IVPB 1000 mg/200 mL premix  Status:  Discontinued     1,000 mg 200 mL/hr over 60 Minutes Intravenous  Once 06/10/19 0833 06/10/19 0836   06/10/19 0845  cefTRIAXone (ROCEPHIN) 2 g in sodium chloride 0.9 % 100 mL IVPB     2 g 200 mL/hr over 30 Minutes Intravenous  Once 06/10/19 1610 06/10/19 0926   06/10/19 0845  clindamycin (CLEOCIN) IVPB 900 mg     900 mg 100 mL/hr over 30 Minutes Intravenous  Once 06/10/19 0834 06/10/19 0926   06/10/19 0845  vancomycin (VANCOCIN) 2,000 mg in sodium chloride 0.9 % 500 mL IVPB     2,000 mg 250 mL/hr over 120 Minutes Intravenous  Once 06/10/19 0836 06/10/19 1153       Assessment/Plan DM Chronic back pain  POD 2, s/p Incision and drainage of 10 cm suprapubic abscess 10/25 Dr.  Luisa Hart cystoscopy per Dr. Berneice Heinrich - cultures shows group B strep - patient with purulent drainage all at the base of his wound.  There is increasing cellulitic changes and induration towards the right groin area. -will return to OR today for washout and further debridement   ID - zosyn/vancomycin 10/25>> FEN - NPO for now VTE - SCDs, sq heparin   LOS: 2 days    Letha Cape , [facility] 06/12/2019, 8:45 AM Please see Amion for pager number during day hours 7:00am-4:30pm

## 2019-06-12 NOTE — Discharge Instructions (Addendum)
WOUND CARE: - dressing to be changed twice daily - supplies: sterile saline, kerlix, scissors, ABD pads, tape  - remove dressing and all packing carefully, moistening with sterile saline as needed to avoid packing/internal dressing sticking to the wound. - clean edges of skin around the wound with water/gauze, making sure there is no tape debris or leakage left on skin that could cause skin irritation or breakdown. - dampen and clean kerlix with sterile saline and pack wound from wound base to skin level, making sure to take note of any possible areas of wound tracking, tunneling and packing appropriately. Wound can be packed loosely. Trim kerlix to size if a whole kerlix is not required. - cover wound with a dry ABD pad and secure with tape.  - write the date/time on the dry dressing/tape to better track when the last dressing change occurred. - apply any skin protectant/powder recommended by clinician to protect skin/skin folds. - change dressing as needed if leakage occurs, wound gets contaminated, or patient requests to shower. - patient may shower daily with wound open and following the shower the wound should be dried and a clean dressing placed.      Carbohydrate Counting for Diabetes Mellitus, Adult  Carbohydrate counting is a method of keeping track of how many carbohydrates you eat. Eating carbohydrates naturally increases the amount of sugar (glucose) in the blood. Counting how many carbohydrates you eat helps keep your blood glucose within normal limits, which helps you manage your diabetes (diabetes mellitus). It is important to know how many carbohydrates you can safely have in each meal. This is different for every person. A diet and nutrition specialist (registered dietitian) can help you make a meal plan and calculate how many carbohydrates you should have at each meal and snack. Carbohydrates are found in the following foods:  Grains, such as breads and cereals.  Dried beans  and soy products.  Starchy vegetables, such as potatoes, peas, and corn.  Fruit and fruit juices.  Milk and yogurt.  Sweets and snack foods, such as cake, cookies, candy, chips, and soft drinks. How do I count carbohydrates? There are two ways to count carbohydrates in food. You can use either of the methods or a combination of both. Reading "Nutrition Facts" on packaged food The "Nutrition Facts" list is included on the labels of almost all packaged foods and beverages in the [place] It includes:  The serving size.  Information about nutrients in each serving, including the grams (g) of carbohydrate per serving. To use the Nutrition Facts":  Decide how many servings you will have.  Multiply the number of servings by the number of carbohydrates per serving.  The resulting number is the total amount of carbohydrates that you will be having. Learning standard serving sizes of other foods When you eat carbohydrate foods that are not packaged or do not include "Nutrition Facts" on the label, you need to measure the servings in order to count the amount of carbohydrates:  Measure the foods that you will eat with a food scale or measuring cup, if needed.  Decide how many standard-size servings you will eat.  Multiply the number of servings by 15. Most carbohydrate-rich foods have about 15 g of carbohydrates per serving. ? For example, if you eat 8 oz (170 g) of strawberries, you will have eaten 2 servings and 30 g of carbohydrates (2 servings x 15 g = 30 g).  For foods that have more than one food mixed, such as soups  and casseroles, you must count the carbohydrates in each food that is included. The following list contains standard serving sizes of common carbohydrate-rich foods. Each of these servings has about 15 g of carbohydrates:   hamburger bun or  English muffin.   oz (15 mL) syrup.   oz (14 g) jelly.  1 slice of bread.  1 six-inch tortilla.  3 oz (85 g) cooked rice  or pasta.  4 oz (113 g) cooked dried beans.  4 oz (113 g) starchy vegetable, such as peas, corn, or potatoes.  4 oz (113 g) hot cereal.  4 oz (113 g) mashed potatoes or  of a large baked potato.  4 oz (113 g) canned or frozen fruit.  4 oz (120 mL) fruit juice.  4-6 crackers.  6 chicken nuggets.  6 oz (170 g) unsweetened dry cereal.  6 oz (170 g) plain fat-free yogurt or yogurt sweetened with artificial sweeteners.  8 oz (240 mL) milk.  8 oz (170 g) fresh fruit or one small piece of fruit.  24 oz (680 g) popped popcorn. Example of carbohydrate counting Sample meal  3 oz (85 g) chicken breast.  6 oz (170 g) brown rice.  4 oz (113 g) corn.  8 oz (240 mL) milk.  8 oz (170 g) strawberries with sugar-free whipped topping. Carbohydrate calculation 1. Identify the foods that contain carbohydrates: ? Rice. ? Corn. ? Milk. ? Strawberries. 2. Calculate how many servings you have of each food: ? 2 servings rice. ? 1 serving corn. ? 1 serving milk. ? 1 serving strawberries. 3. Multiply each number of servings by 15 g: ? 2 servings rice x 15 g = 30 g. ? 1 serving corn x 15 g = 15 g. ? 1 serving milk x 15 g = 15 g. ? 1 serving strawberries x 15 g = 15 g. 4. Add together all of the amounts to find the total grams of carbohydrates eaten: ? 30 g + 15 g + 15 g + 15 g = 75 g of carbohydrates total. Summary  Carbohydrate counting is a method of keeping track of how many carbohydrates you eat.  Eating carbohydrates naturally increases the amount of sugar (glucose) in the blood.  Counting how many carbohydrates you eat helps keep your blood glucose within normal limits, which helps you manage your diabetes.  A diet and nutrition specialist (registered dietitian) can help you make a meal plan and calculate how many carbohydrates you should have at each meal and snack. This information is not intended to replace advice given to you by your health care provider. Make sure you  discuss any questions you have with your health care provider. Document Released: 08/02/2005 Document Revised: 02/24/2017 Document Reviewed: 01/14/2016 Elsevier Patient Education  [place].  Insulin Injection Instructions, Using Insulin Pens, Adult A subcutaneous injection is a shot of medicine that is injected into the layer of fat and tissue between skin and muscle. People with type 1 diabetes must take insulin because their bodies do not make it. People with type 2 diabetes may need to take insulin. There are many different types of insulin. The type of insulin that you take may determine how many injections you give yourself and when you need to give the injections. Supplies needed:  Soap and water to wash hands.  Your insulin pen.  A new, unused needle.  Alcohol wipes.  A disposal container that is meant for sharp items (sharps container), such as an empty plastic  bottle with a cover. How to choose a site for injection The body absorbs insulin differently, depending on where the insulin is injected (injection site). It is best to inject insulin into the same body area each time (for example, always in the abdomen), but you should use a different spot in that area for each injection. Do not inject the insulin in the same spot each time. There are five main areas that can be used for injecting. These areas include:  Abdomen. This is the preferred area.  Front of thigh.  Upper, outer side of thigh.  Upper, outer side of arm.  Upper, outer part of buttock. How to use an insulin pen  First, follow the steps for Get ready, then continue with the steps for Inject the insulin. Get ready 1. Wash your hands with soap and water. If soap and water are not available, use hand sanitizer. 2. Before you give yourself an insulin injection, be sure to test your blood sugar level (blood glucose level) and write down that number. Follow any instructions from your health care provider  about what to do if your blood glucose level is higher or lower than your normal range. 3. Check the expiration date and the type of insulin that is in the pen. 4. If you are using CLEAR insulin, check to see that it is clear and free of clumps. 5. If you are using CLOUDY insulin, do not shake the pen to get the injection ready. Instead, get it ready in one of these ways: ? Gently roll the pen between your palms several times. ? Tip the pen up and down several times. 6. Remove the cap from the insulin pen. 7. Use an alcohol wipe to clean the rubber tip of the pen. 8. Remove the protective paper tab from the disposable needle. Do not let the needle touch anything. 9. Screw a new, unused needle onto the pen. 10. Remove the outer plastic needle cover. Do not throw away the outer plastic cover yet. ? If the pen uses a special safety needle, leave the inner needle shield in place. ? If the pen does not use a special safety needle, remove the inner plastic cover from the needle. 11. Follow the manufacturer's instructions to prime the insulin pen with the volume of insulin needed. Hold the pen with the needle pointing up, and push the button on the opposite end of the pen until a drop of insulin appears at the needle tip. If no insulin appears, repeat this step. 12. Turn the button (dial) to the number of units of insulin that you will be injecting. Inject the insulin 1. Use an alcohol wipe to clean the site where you will be injecting the needle. Let the site air-dry. 2. Hold the pen in the palm of your writing hand like a pencil. 3. If directed by your health care provider, use your other hand to pinch and hold about an inch (2.5 cm) of skin at the injection site. Do not directly touch the cleaned part of the skin. 4. Gently but quickly, use your writing hand to put the needle straight into the skin. The needle should be at a 90-degree angle (perpendicular) to the skin. 5. When the needle is  completely inserted into the skin, use your thumb or index finger of your writing hand to push the top button of the pen down all the way to inject the insulin. 6. Let go of the skin that you are pinching. Continue to  hold the pen in place with your writing hand. 7. Wait 10 seconds, then pull the needle straight out of the skin. This will allow all of the insulin to go from the pen and needle into your body. 8. Carefully put the larger (outer) plastic cover of the needle back over the needle, then unscrew the capped needle and discard it in a sharps container, such as an empty plastic bottle with a cover. 9. Put the plastic cap back on the insulin pen. How to throw away supplies  Discard all used needles in a puncture-proof sharps disposal container. You can ask your local pharmacy about where you can get this kind of disposal container, or you can use an empty plastic liquid laundry detergent bottle that has a cover.  Follow the disposal regulations for the area where you live. Do not use any needle more than one time.  Throw away empty disposable pens in the regular trash. Questions to ask your health care provider  How often should I be taking insulin?  How often should I check my blood glucose?  What amount of insulin should I be taking at each time?  What are the side effects?  What should I do if my blood glucose is too high?  What should I do if my blood glucose is too low?  What should I do if I forget to take my insulin?  What number should I call if I have questions? Where to find more information  American Diabetes Association (ADA): www.diabetes.org  American Association of Diabetes Educators (AADE) Patient Resources: https://www.diabeteseducator.org Summary  A subcutaneous injection is a shot of medicine that is injected into the layer of fat and tissue between skin and muscle.  Before you give yourself an insulin injection, be sure to test your blood sugar level  (blood glucose level) and write down that number.  Check the expiration date and the type of insulin that is in the pen. The type of insulin that you take may determine how many injections you give yourself and when you need to give the injections.  It is best to inject insulin into the same body area each time (for example, always in the abdomen), but you should use a different spot in that area for each injection. This information is not intended to replace advice given to you by your health care provider. Make sure you discuss any questions you have with your health care provider. Document Released: 09/05/2015 Document Revised: 08/22/2017 Document Reviewed: 09/05/2015 Elsevier Patient Education  2020 [place].  Hypoglycemia Hypoglycemia is when the sugar (glucose) level in your blood is too low. Signs of low blood sugar may include:  Feeling: ? Hungry. ? Worried or nervous (anxious). ? Sweaty and clammy. ? Confused. ? Dizzy. ? Sleepy. ? Sick to your stomach (nauseous).  Having: ? A fast heartbeat. ? A headache. ? A change in your vision. ? Tingling or no feeling (numbness) around your mouth, lips, or tongue. ? Jerky movements that you cannot control (seizure).  Having trouble with: ? Moving (coordination). ? Sleeping. ? Passing out (fainting). ? Getting upset easily (irritability). Low blood sugar can happen to people who have diabetes and people who do not have diabetes. Low blood sugar can happen quickly, and it can be an emergency. Treating low blood sugar Low blood sugar is often treated by eating or drinking something sugary right away, such as:  Fruit juice, 4-6 oz (120-150 mL).  Regular soda (not diet soda), 4-6 oz (  120-150 mL).  Low-fat milk, 4 oz (120 mL).  Several pieces of hard candy.  Sugar or honey, 1 Tbsp (15 mL). Treating low blood sugar if you have diabetes If you can think clearly and swallow safely, follow the 15:15 rule:  Take 15 grams of a  fast-acting carb (carbohydrate). Talk with your doctor about how much you should take.  Always keep a source of fast-acting carb with you, such as: ? Sugar tablets (glucose pills). Take 3-4 pills. ? 6-8 pieces of hard candy. ? 4-6 oz (120-150 mL) of fruit juice. ? 4-6 oz (120-150 mL) of regular (not diet) soda. ? 1 Tbsp (15 mL) honey or sugar.  Check your blood sugar 15 minutes after you take the carb.  If your blood sugar is still at or below 70 mg/dL (3.9 mmol/L), take 15 grams of a carb again.  If your blood sugar does not go above 70 mg/dL (3.9 mmol/L) after 3 tries, get help right away.  After your blood sugar goes back to normal, eat a meal or a snack within 1 hour.  Treating very low blood sugar If your blood sugar is at or below 54 mg/dL (3 mmol/L), you have very low blood sugar (severe hypoglycemia). This may also cause:  Passing out.  Jerky movements you cannot control (seizure).  Losing consciousness (coma). This is an emergency. Do not wait to see if the symptoms will go away. Get medical help right away. Call your local emergency services (911 in the [place] Do not drive yourself to the hospital. If you have very low blood sugar and you cannot eat or drink, you may need a glucagon shot (injection). A family member or friend should learn how to check your blood sugar and how to give you a glucagon shot. Ask your doctor if you need to have a glucagon shot kit at home. Follow these instructions at home: General instructions  Take over-the-counter and prescription medicines only as told by your doctor.  Stay aware of your blood sugar as told by your doctor.  Limit alcohol intake to no more than 1 drink a day for nonpregnant women and 2 drinks a day for men. One drink equals 12 oz of beer (355 mL), 5 oz of wine (148 mL), or 1 oz of hard liquor (44 mL).  Keep all follow-up visits as told by your doctor. This is important. If you have diabetes:   Follow your diabetes  care plan as told by your doctor. Make sure you: ? Know the signs of low blood sugar. ? Take your medicines as told. ? Follow your exercise and meal plan. ? Eat on time. Do not skip meals. ? Check your blood sugar as often as told by your doctor. Always check it before and after exercise. ? Follow your sick day plan when you cannot eat or drink normally. Make this plan ahead of time with your doctor.  Share your diabetes care plan with: ? Your work or school. ? People you live with.  Check your pee (urine) for ketones: ? When you are sick. ? As told by your doctor.  Carry a card or wear jewelry that says you have diabetes. Contact a doctor if:  You have trouble keeping your blood sugar in your target range.  You have low blood sugar often. Get help right away if:  You still have symptoms after you eat or drink something sugary.  Your blood sugar is at or below 54 mg/dL (3 mmol/L).  You have jerky movements that you cannot control.  You pass out. These symptoms may be an emergency. Do not wait to see if the symptoms will go away. Get medical help right away. Call your local emergency services (911 in the [place] Do not drive yourself to the hospital. Summary  Hypoglycemia happens when the level of sugar (glucose) in your blood is too low.  Low blood sugar can happen to people who have diabetes and people who do not have diabetes. Low blood sugar can happen quickly, and it can be an emergency.  Make sure you know the signs of low blood sugar and know how to treat it.  Always keep a source of sugar (fast-acting carb) with you to treat low blood sugar. This information is not intended to replace advice given to you by your health care provider. Make sure you discuss any questions you have with your health care provider. Document Released: 10/27/2009 Document Revised: 11/23/2018 Document Reviewed: 09/05/2015 Elsevier Patient Education  [place].  Hyperglycemia Hyperglycemia occurs when the level of sugar (glucose) in the blood is too high. Glucose is a type of sugar that provides the body's main source of energy. Certain hormones (insulin and glucagon) control the level of glucose in the blood. Insulin lowers blood glucose, and glucagon increases blood glucose. Hyperglycemia can result from having too little insulin in the bloodstream, or from the body not responding normally to insulin. Hyperglycemia occurs most often in people who have diabetes (diabetes mellitus), but it can happen in people who do not have diabetes. It can develop quickly, and it can be life-threatening if it causes you to become severely dehydrated (diabetic ketoacidosis or hyperglycemic hyperosmolar state). Severe hyperglycemia is a medical emergency. What are the causes? If you have diabetes, hyperglycemia may be caused by:  Diabetes medicine.  Medicines that increase blood glucose or affect your diabetes control.  Not eating enough, or not eating often enough.  Changes in physical activity level.  Being sick or having an infection. If you have prediabetes or undiagnosed diabetes:  Hyperglycemia may be caused by those conditions. If you do not have diabetes, hyperglycemia may be caused by:  Certain medicines, including steroid medicines, beta-blockers, epinephrine, and thiazide diuretics.  Stress.  Serious illness.  Surgery.  Diseases of the pancreas.  Infection. What increases the risk? Hyperglycemia is more likely to develop in people who have risk factors for diabetes, such as:  Having a family member with diabetes.  Having a gene for type 1 diabetes that is passed from parent to child (inherited).  Living in an area with cold weather conditions.  Exposure to certain viruses.  Certain conditions in which the body's disease-fighting (immune) system attacks itself (autoimmune disorders).  Being overweight or obese.  Having an  inactive (sedentary) lifestyle.  Having been diagnosed with insulin resistance.  Having a history of prediabetes, gestational diabetes, or polycystic ovarian syndrome (PCOS).  Being of American-Indian, African-American, Hispanic/Latino, or Asian/Pacific Islander descent. What are the signs or symptoms? Hyperglycemia may not cause any symptoms. If you do have symptoms, they may include early warning signs, such as:  Increased thirst.  Hunger.  Feeling very tired.  Needing to urinate more often than usual.  Blurry vision. Other symptoms may develop if hyperglycemia gets worse, such as:  Dry mouth.  Loss of appetite.  Fruity-smelling breath.  Weakness.  Unexpected or rapid weight gain or weight loss.  Tingling or numbness in the hands or feet.  Headache.  Skin  that does not quickly return to normal after being lightly pinched and released (poor skin turgor).  Abdominal pain.  Cuts or bruises that are slow to heal. How is this diagnosed? Hyperglycemia is diagnosed with a blood test to measure your blood glucose level. This blood test is usually done while you are having symptoms. Your health care provider may also do a physical exam and review your medical history. You may have more tests to determine the cause of your hyperglycemia, such as:  A fasting blood glucose (FBG) test. You will not be allowed to eat (you will fast) for at least 8 hours before a blood sample is taken.  An A1c (hemoglobin A1c) blood test. This provides information about blood glucose control over the previous 2-3 months.  An oral glucose tolerance test (OGTT). This measures your blood glucose at two times: ? After fasting. This is your baseline blood glucose level. ? Two hours after drinking a beverage that contains glucose. How is this treated? Treatment depends on the cause of your hyperglycemia. Treatment may include:  Taking medicine to regulate your blood glucose levels. If you take  insulin or other diabetes medicines, your medicine or dosage may be adjusted.  Lifestyle changes, such as exercising more, eating healthier foods, or losing weight.  Treating an illness or infection, if this caused your hyperglycemia.  Checking your blood glucose more often.  Stopping or reducing steroid medicines, if these caused your hyperglycemia. If your hyperglycemia becomes severe and it results in hyperglycemic hyperosmolar state, you must be hospitalized and given IV fluids. Follow these instructions at home:  General instructions  Take over-the-counter and prescription medicines only as told by your health care provider.  Do not use any products that contain nicotine or tobacco, such as cigarettes and e-cigarettes. If you need help quitting, ask your health care provider.  Limit alcohol intake to no more than 1 drink per day for nonpregnant women and 2 drinks per day for men. One drink equals 12 oz of beer, 5 oz of wine, or 1 oz of hard liquor.  Learn to manage stress. If you need help with this, ask your health care provider.  Keep all follow-up visits as told by your health care provider. This is important. Eating and drinking   Maintain a healthy weight.  Exercise regularly, as directed by your health care provider.  Stay hydrated, especially when you exercise, get sick, or spend time in hot temperatures.  Eat healthy foods, such as: ? Lean proteins. ? Complex carbohydrates. ? Fresh fruits and vegetables. ? Low-fat dairy products. ? Healthy fats.  Drink enough fluid to keep your urine clear or pale yellow. If you have diabetes:  Make sure you know the symptoms of hyperglycemia.  Follow your diabetes management plan, as told by your health care provider. Make sure you: ? Take your insulin and medicines as directed. ? Follow your exercise plan. ? Follow your meal plan. Eat on time, and do not skip meals. ? Check your blood glucose as often as directed. Make  sure to check your blood glucose before and after exercise. If you exercise longer or in a different way than usual, check your blood glucose more often. ? Follow your sick day plan whenever you cannot eat or drink normally. Make this plan in advance with your health care provider.  Share your diabetes management plan with people in your workplace, school, and household.  Check your urine for ketones when you are ill and as told  by your health care provider.  Carry a medical alert card or wear medical alert jewelry. Contact a health care provider if:  Your blood glucose is at or above 240 mg/dL (13.3 mmol/L) for 2 days in a row.  You have problems keeping your blood glucose in your target range.  You have frequent episodes of hyperglycemia. Get help right away if:  You have difficulty breathing.  You have a change in how you think, feel, or act (mental status).  You have nausea or vomiting that does not go away. These symptoms may represent a serious problem that is an emergency. Do not wait to see if the symptoms will go away. Get medical help right away. Call your local emergency services (911 in the [place] Do not drive yourself to the hospital. Summary  Hyperglycemia occurs when the level of sugar (glucose) in the blood is too high.  Hyperglycemia is diagnosed with a blood test to measure your blood glucose level. This blood test is usually done while you are having symptoms. Your health care provider may also do a physical exam and review your medical history.  If you have diabetes, follow your diabetes management plan as told by your health care provider.  Contact your health care provider if you have problems keeping your blood glucose in your target range. This information is not intended to replace advice given to you by your health care provider. Make sure you discuss any questions you have with your health care provider. Document Released: 01/26/2001 Document Revised:  04/19/2016 Document Reviewed: 04/19/2016 Elsevier Patient Education  [place].  Hemoglobin A1c Test Why am I having this test? You may have the hemoglobin A1c test (HbA1c test) done to:  Evaluate your risk for developing diabetes (diabetes mellitus).  Diagnose diabetes.  Monitor long-term control of blood sugar (glucose) in people who have diabetes and help make treatment decisions. This test may be done with other blood glucose tests, such as fasting blood glucose and oral glucose tolerance tests. What is being tested? Hemoglobin is a type of protein in the blood that carries oxygen. Glucose attaches to hemoglobin to form glycated hemoglobin. This test checks the amount of glycated hemoglobin in your blood, which is a good indicator of the average amount of glucose in your blood during the past 2-3 months. What kind of sample is taken?  A blood sample is required for this test. It is usually collected by inserting a needle into a blood vessel. Tell a health care provider about:  All medicines you are taking, including vitamins, herbs, eye drops, creams, and over-the-counter medicines.  Any blood disorders you have.  Any surgeries you have had.  Any medical conditions you have.  Whether you are pregnant or may be pregnant. How are the results reported? Your results will be reported as a percentage that indicates how much of your hemoglobin has glucose attached to it (is glycated). Your health care provider will compare your results to normal ranges that were established after testing a large group of people (reference ranges). Reference ranges may vary among labs and hospitals. For this test, common reference ranges are:  Adult or child without diabetes: 4-5.6%.  Adult or child with diabetes and good blood glucose control: less than 7%. What do the results mean? If you have diabetes:  A result of less than 7% is considered normal, meaning that your blood glucose is  well controlled.  A result higher than 7% means that your blood glucose is  not well controlled, and your treatment plan may need to be adjusted. If you do not have diabetes:  A result within the reference range is considered normal, meaning that you are not at high risk for diabetes.  A result of 5.7-6.4% means that you have a high risk of developing diabetes, and you may have prediabetes. Prediabetes is the condition of having a blood glucose level that is higher than it should be, but not high enough for you to be diagnosed with diabetes. Having prediabetes puts you at risk for developing type 2 diabetes (type 2 diabetes mellitus). You may have more tests, including a repeat HbA1c test.  Results of 6.5% or higher on two separate HbA1c tests mean that you have diabetes. You may have more tests to confirm the diagnosis. Abnormally low HbA1c values may be caused by:  Pregnancy.  Severe blood loss.  Receiving donated blood (transfusions).  Low red blood cell count (anemia).  Long-term kidney failure.  Some unusual forms (variants) of hemoglobin. Talk with your health care provider about what your results mean. Questions to ask your health care provider Ask your health care provider, or the department that is doing the test:  When will my results be ready?  How will I get my results?  What are my treatment options?  What other tests do I need?  What are my next steps? Summary  The hemoglobin A1c test (HbA1c test) may be done to evaluate your risk for developing diabetes, to diagnose diabetes, and to monitor long-term control of blood sugar (glucose) in people who have diabetes and help make treatment decisions.  Hemoglobin is a type of protein in the blood that carries oxygen. Glucose attaches to hemoglobin to form glycated hemoglobin. This test checks the amount of glycated hemoglobin in your blood, which is a good indicator of the average amount of glucose in your blood during  the past 2-3 months.  Talk with your health care provider about what your results mean. This information is not intended to replace advice given to you by your health care provider. Make sure you discuss any questions you have with your health care provider. Document Released: 08/24/2004 Document Revised: 07/15/2017 Document Reviewed: 03/15/2017 Elsevier Patient Education  2020 [place].  Blood Glucose Monitoring, Adult Monitoring your blood sugar (glucose) is an important part of managing your diabetes (diabetes mellitus). Blood glucose monitoring involves checking your blood glucose as often as directed and keeping a record (log) of your results over time. Checking your blood glucose regularly and keeping a blood glucose log can:  Help you and your health care provider adjust your diabetes management plan as needed, including your medicines or insulin.  Help you understand how food, exercise, illnesses, and medicines affect your blood glucose.  Let you know what your blood glucose is at any time. You can quickly find out if you have low blood glucose (hypoglycemia) or high blood glucose (hyperglycemia). Your health care provider will set individualized treatment goals for you. Your goals will be based on your age, other medical conditions you have, and how you respond to diabetes treatment. Generally, the goal of treatment is to maintain the following blood glucose levels:  Before meals (preprandial): 80-130 mg/dL (4.4-7.2 mmol/L).  After meals (postprandial): below 180 mg/dL (10 mmol/L).  A1c level: less than 7%. Supplies needed:  Blood glucose meter.  Test strips for your meter. Each meter has its own strips. You must use the strips that came with your meter.  A  needle to prick your finger (lancet). Do not use a lancet more than one time.  A device that holds the lancet (lancing device).  A journal or log book to write down your results. How to check your blood  glucose  10. Wash your hands with soap and water. 11. Prick the side of your finger (not the tip) with the lancet. Use a different finger each time. 12. Gently rub the finger until a small drop of blood appears. 13. Follow instructions that come with your meter for inserting the test strip, applying blood to the strip, and using your blood glucose meter. 25. Write down your result and any notes. Some meters allow you to use areas of your body other than your finger (alternative sites) to test your blood. The most common alternative sites are:  Forearm.  Thigh.  Palm of the hand. If you think you may have hypoglycemia, or if you have a history of not knowing when your blood glucose is getting low (hypoglycemia unawareness), do not use alternative sites. Use your finger instead. Alternative sites may not be as accurate as the fingers, because blood flow is slower in these areas. This means that the result you get may be delayed, and it may be different from the result that you would get from your finger. Follow these instructions at home: Blood glucose log   Every time you check your blood glucose, write down your result. Also write down any notes about things that may be affecting your blood glucose, such as your diet and exercise for the day. This information can help you and your health care provider: ? Look for patterns in your blood glucose over time. ? Adjust your diabetes management plan as needed.  Check if your meter allows you to download your records to a computer. Most glucose meters store a record of glucose readings in the meter. If you have type 1 diabetes:  Check your blood glucose 2 or more times a day.  Also check your blood glucose: ? Before every insulin injection. ? Before and after exercise. ? Before meals. ? 2 hours after a meal. ? Occasionally between 2:00 a.m. and 3:00 a.m., as directed. ? Before potentially dangerous tasks, like driving or using heavy  machinery. ? At bedtime.  You may need to check your blood glucose more often, up to 6-10 times a day, if you: ? Use an insulin pump. ? Need multiple daily injections (MDI). ? Have diabetes that is not well-controlled. ? Are ill. ? Have a history of severe hypoglycemia. ? Have hypoglycemia unawareness. If you have type 2 diabetes:  If you take insulin or other diabetes medicines, check your blood glucose 2 or more times a day.  If you are on intensive insulin therapy, check your blood glucose 4 or more times a day. Occasionally, you may also need to check between 2:00 a.m. and 3:00 a.m., as directed.  Also check your blood glucose: ? Before and after exercise. ? Before potentially dangerous tasks, like driving or using heavy machinery.  You may need to check your blood glucose more often if: ? Your medicine is being adjusted. ? Your diabetes is not well-controlled. ? You are ill. General tips  Always keep your supplies with you.  If you have questions or need help, all blood glucose meters have a 24-hour "hotline" phone number that you can call. You may also contact your health care provider.  After you use a few boxes of test strips, adjust (  calibrate) your blood glucose meter by following instructions that came with your meter. Contact a health care provider if:  Your blood glucose is at or above 240 mg/dL (13.3 mmol/L) for 2 days in a row.  You have been sick or have had a fever for 2 days or longer, and you are not getting better.  You have any of the following problems for more than 6 hours: ? You cannot eat or drink. ? You have nausea or vomiting. ? You have diarrhea. Get help right away if:  Your blood glucose is lower than 54 mg/dL (3 mmol/L).  You become confused or you have trouble thinking clearly.  You have difficulty breathing.  You have moderate or large ketone levels in your urine. Summary  Monitoring your blood sugar (glucose) is an important part  of managing your diabetes (diabetes mellitus).  Blood glucose monitoring involves checking your blood glucose as often as directed and keeping a record (log) of your results over time.  Your health care provider will set individualized treatment goals for you. Your goals will be based on your age, other medical conditions you have, and how you respond to diabetes treatment.  Every time you check your blood glucose, write down your result. Also write down any notes about things that may be affecting your blood glucose, such as your diet and exercise for the day. This information is not intended to replace advice given to you by your health care provider. Make sure you discuss any questions you have with your health care provider. Document Released: 08/05/2003 Document Revised: 05/26/2018 Document Reviewed: 01/12/2016 Elsevier Patient Education  2020 [place] With Diabetes  Why Is Carbohydrate Counting Important?  Counting carbohydrate servings may help you control your blood glucose level so that you feel better.   The balance between the carbohydrates you eat and insulin determines what your blood glucose level will be after eating.   Carbohydrate counting can also help you plan your meals. Which Foods Have Carbohydrates? Foods with carbohydrates include:  Breads, crackers, and cereals   Pasta, rice, and grains   Starchy vegetables, such as potatoes, corn, and peas   Beans and legumes   Milk, soy milk, and yogurt   Fruits and fruit juices   Sweets, such as cakes, cookies, ice cream, jam, and jelly Carbohydrate Servings In diabetes meal planning, 1 serving of a food with carbohydrate has about 15 grams of carbohydrate:  Check serving sizes with measuring cups and spoons or a food scale.   Read the Nutrition Facts on food labels to find out how many grams of carbohydrate are in foods you eat. The food lists in this handout show  portions that have about 15 grams of carbohydrate.  Tips Meal Planning Tips  An Eating Plan tells you how many carbohydrate servings to eat at your meals and snacks. For many adults, eating 3 to 5 servings of carbohydrate foods at each meal and 1 or 2 carbohydrate servings for each snack works well.   In a healthy daily Eating Plan, most carbohydrates come from:   At least 6 servings of fruits and nonstarchy vegetables   At least 6 servings of grains, beans, and starchy vegetables, with at least 3 servings from whole grains   At least 2 servings of milk or milk products  Check your blood glucose level regularly. It can tell you if you need to adjust when you eat carbohydrates.   Eating foods that  have fiber, such as whole grains, and having very few salty foods is good for your health.   Eat 4 to 6 ounces of meat or other protein foods (such as soybean burgers) each day. Choose low-fat sources of protein, such as lean beef, lean pork, chicken, fish, low-fat cheese, or vegetarian foods such as soy.   Eat some healthy fats, such as olive oil, canola oil, and nuts.   Eat very little saturated fats. These unhealthy fats are found in butter, cream, and high-fat meats, such as bacon and sausage.   Eat very little or no trans fats. These unhealthy fats are found in all foods that list partially hydrogenated oil as an ingredient.  Label Reading Tips The Nutrition Facts panel on a label lists the grams of total carbohydrate in 1 standard serving. The label's standard serving may be larger or smaller than 1 carbohydrate serving. To figure out how many carbohydrate servings are in the food:  First, look at the label's standard serving size.   Check the grams of total carbohydrate. This is the amount of carbohydrate in 1 standard serving.   Divide the grams of total carbohydrate by 15. This number equals the number of carbohydrate servings in 1 standard serving. Remember: 1 carbohydrate  serving is 15 grams of carbohydrate.   Note: You may ignore the grams of sugars on the Nutrition Facts panel because they are included in the grams of total carbohydrate.  Foods Recommended 1 serving = about 15 grams of carbohydrate Starches  1 slice bread (1 ounce)   1 tortilla (6-inch size)    large bagel (1 ounce)   2 taco shells (5-inch size)    hamburger or hot dog bun ( ounce)    cup ready-to-eat unsweetened cereal    cup cooked cereal   1 cup broth-based soup   4 to 6 small crackers   1/3 cup pasta or rice (cooked)    cup beans, peas, corn, sweet potatoes, winter squash, or mashed or boiled potatoes (cooked)    large baked potato (3 ounces)    ounce pretzels, potato chips, or tortilla chips   3 cups popcorn (popped) Fruit  1 small fresh fruit ( to 1 cup)    cup canned or frozen fruit   2 tablespoons dried fruit (blueberries, cherries, cranberries, mixed fruit, raisins)   17 small grapes (3 ounces)   1 cup melon or berries    cup unsweetened fruit juice Milk  1 cup fat-free or reduced-fat milk   1 cup soy milk   2/3 cup (6 ounces) nonfat yogurt sweetened with sugar-free sweetener Sweets and Desserts  2-inch square cake (unfrosted)   2 small cookies (2/3 ounce)    cup ice cream or frozen yogurt    cup sherbet or sorbet   1 tablespoon syrup, jam, jelly, table sugar, or honey   2 tablespoons light syrup Other Foods  Count 1 cup raw vegetables or  cup cooked nonstarchy vegetables as zero (0) carbohydrate servings or free foods. If you eat 3 or more servings at one meal, count them as 1 carbohydrate serving.   Foods that have less than 20 calories in each serving also may be counted as zero carbohydrate servings or free foods.   Count 1 cup of casserole or other mixed foods as 2 carbohydrate servings.  Carbohydrate Counting for People with Diabetes Sample 1-Day Menu  Breakfast 1 extra-small banana (1 carbohydrate serving)   1 cup low-fat or fat-free milk (1 carbohydrate  serving)  1 slice whole wheat bread (1 carbohydrate serving)  1 teaspoon margarine  Lunch 2 ounces [place] slices  2 slices whole wheat bread (2 carbohydrate servings)  2 lettuce leaves  4 celery sticks  4 carrot sticks  1 medium apple (1 carbohydrate serving)  1 cup low-fat or fat-free milk (1 carbohydrate serving)  Afternoon Snack 2 tablespoons raisins (1 carbohydrate serving)  3/4 ounce unsalted mini pretzels (1 carbohydrate serving)  Evening Meal 3 ounces lean roast beef  1/2 large baked potato (2 carbohydrate servings)  1 tablespoon reduced-fat sour cream  1/2 cup green beans  1 tablespoon light salad dressing  1 whole wheat dinner roll (1 carbohydrate serving)  1 teaspoon margarine  1 cup melon balls (1 carbohydrate serving)  Evening Snack 2 tablespoons unsalted nuts   Carbohydrate Counting for People with Diabetes Vegan Sample 1-Day Menu  Breakfast 1 cup cooked oatmeal (2 carbohydrate servings)   cup blueberries (1 carbohydrate serving)  2 tablespoons flaxseeds  1 cup soymilk fortified with calcium and vitamin D  1 cup coffee  Lunch 2 slices whole wheat bread (2 carbohydrate servings)   cup baked tofu   cup lettuce  2 slices tomato  2 slices avocado   cup baby carrots  1 orange (1 carbohydrate serving)  1 cup soymilk fortified with calcium and vitamin D   Evening Meal Burrito made with: 1 6-inch corn tortilla (1 carbohydrate serving)  1 cup refried vegetarian beans (1 carbohydrate serving)   cup chopped tomatoes   cup lettuce   cup salsa  1/3 cup brown rice (1 carbohydrate serving)  1 tablespoon olive oil for rice   cup zucchini   Evening Snack 6 small whole grain crackers (1 carbohydrate serving)  2 apricots ( carbohydrate serving)   cup unsalted peanuts ( carbohydrate serving)     Carbohydrate Counting for People with Diabetes Vegetarian (Lacto-Ovo) Sample 1-Day Menu  Breakfast 1 cup cooked oatmeal  (2 carbohydrate servings)   cup blueberries (1 carbohydrate serving)  2 tablespoons flaxseeds  1 egg  1 cup 1% milk (1 carbohydrate serving)  1 cup coffee  Lunch 2 slices whole wheat bread (2 carbohydrate servings)  2 ounces low-fat cheese   cup lettuce  2 slices tomato  2 slices avocado   cup baby carrots  1 orange (1 carbohydrate serving)  1 cup unsweetened tea  Evening Meal Burrito made with: 1 6-inch corn tortilla (1 carbohydrate serving)   cup refried vegetarian beans (1 carbohydrate serving)   cup tomatoes   cup lettuce   cup salsa  1/3 cup brown rice (1 carbohydrate serving)  1 tablespoon olive oil for rice   cup zucchini  1 cup 1% milk (1 carbohydrate serving)  Evening Snack 6 small whole grain crackers (1 carbohydrate serving)  2 apricots ( carbohydrate serving)   cup unsalted peanuts ( carbohydrate serving)    Copyright 2020  [employer]. All rights reserved.  Using Nutrition Labels: Carbohydrate   Serving Size   Look at the serving size. All the information on the label is based on this portion.  Servings Per Container   The number of servings contained in the package.  Guidelines for Carbohydrate   Look at the total grams of carbohydrate in the serving size.   1 carbohydrate choice = 15 grams of carbohydrate. Range of Carbohydrate Grams Per Choice  Carbohydrate Grams/Choice Carbohydrate Choices  6-10   11-20 1  21-25 1  26-35 2  36-40 2  41-50 3  51-55 3  56-65 4  66-70 4  71-80 5    Copyright 2020  [employer]. All rights reserved.

## 2019-06-12 NOTE — Progress Notes (Addendum)
Family Medicine Teaching Service Daily Progress Note Intern Pager: [phone]  Patient name: Mario Little Medical record number: [id number] Date of birth: 04/29/1976 Age: 43 y.o. Gender: male  Primary Care Provider: Patient, No Pcp Per Consultants: General surgery, urology Code Status: Full  Pt Overview and Major Events to Date:  10/25-patient admitted 10/25-I&D and cystoscopy  Assessment and Plan: Mario Little is a 43 y.o. male presenting with groin pain and penile swelling. PMH is significant for chronic back pain, bipolar 1, hypertension.  Groin Abscess - meeting sepsis criteria Status post surgical I&D.  Current WBC 12.2, improved from 14.0 on initial presentation.  CT scan showed soft tissue edema with subcutaneous emphysema and overlying skin thickening involving the greater groin which was consistent with Fournier gangrene.  -General surgery and urology on board, appreciate recommendation  -Plan is to take patient back surgery for additional debridement today. -IV Dilaudid for pain and MiraLAX daily -Vancomycin, Zosyn as antibiotic regimen for ?Fournier's gangrene though seems more likely to be abscess at this point, cultures pending. -PT/OT -Follow-up on urine and blood cultures -Glucose management as below  New onset-type 2 diabetes Patient currently on 20 units lantus, used 17 units novolog over last 24hrs. A1c of 16.  Blood glucose levels overnight range 228-239. TSH decreased 0.108. Blood glucose on presentation of 336. Patient without history of type 2 diabetes.  Currently on no medications for this. Patient does have a history of type 2 diabetes in his father.  Urinalysis on presentation was positive for glucose of greater than 500. - Lantus 20units  -Sensitive sliding scale -On discharge will discuss oral medications  Hypokalemia Potassium 3.1 this morning.  Replaced with 4 runs of IV. -BMP in morning  Chronic back pain Home medications include Percocet  5-325 every 6 hours as needed, Zanaflex 4 mg as needed, gabapentin 600 mg 3 times daily -Continue home gabapentin -Continue home Zanaflex as needed  Patient reported he is psych patient: Currently on no home medications.    Not on any home medications.  Unable to find further information via chart review.   -Continue to monitor  FEN/GI: N.p.o. pending second surgical debridement today. PPx: Heparin subq  Disposition: Likely home pending clinical work-up.  Subjective:  Patient complaining of pain near his incision site this morning, particularly as he moves and during bandage changing.  Patient states that he just recently moved to [place] and due to housing situation has been sleeping on his father's couch.  He requested a consult to social work to look for potential options for housing at this time.  Objective: Temp:  [98.5 F (36.9 C)-99.4 F (37.4 C)] 98.9 F (37.2 C) (10/27 0435) Pulse Rate:  [70-84] 70 (10/27 0435) Resp:  [16-19] 16 (10/27 0435) BP: (123-144)/(69-79) 123/72 (10/27 0435) SpO2:  [96 %-100 %] 96 % (10/27 0435) Physical Exam: General: Alert and oriented in no apparent distress Heart: Regular rate and rhythm with no murmurs appreciated Lungs: CTA bilaterally, no wheezing GU: Surgical bandage present on mons/inguinal area, penis and scrotum continue to be swollen however nontender.  Laboratory: Recent Labs  Lab 06/10/19 0823  06/10/19 0954 06/11/19 0248 06/12/19 0723  WBC 14.0*  --   --  13.3* 12.2*  HGB 12.8*   < > 12.9* 11.6* 11.6*  HCT 39.9   < > 38.0* 35.6* 35.7*  PLT 257  --   --  244 270   < > = values in this interval not displayed.   Recent Labs  Lab 06/10/19 0823  06/10/19 1125 06/11/19 0248 06/12/19 0723  NA 135   < > 136 137 136  K 3.3*   < > 3.2* 3.8 3.1*  CL 100   < > 104 104 101  CO2 16*  --  16* 18* 24  BUN 5*   < > 6 <5* 7  CREATININE 1.10   < > 0.91 0.89 0.82  CALCIUM 9.0  --  8.4* 8.3* 8.1*  PROT 6.7  --   --   --   --    BILITOT 1.3*  --   --   --   --   ALKPHOS 114  --   --   --   --   ALT 18  --   --   --   --   AST 14*  --   --   --   --   GLUCOSE 353*   < > 294* 300* 222*   < > = values in this interval not displayed.    Mr Thoracic Spine Wo Contrast  Result Date: 05/25/2019 CLINICAL DATA:  Mid back pain EXAM: MRI THORACIC SPINE WITHOUT CONTRAST TECHNIQUE: Multiplanar, multisequence MR imaging of the thoracic spine was performed. No intravenous contrast was administered. COMPARISON:  X-ray 04/30/2019.  Lumbar spine MRI 04/27/2019 FINDINGS: Technical note: Motion degraded examination, particularly affecting the axial sequences. Alignment:  Physiologic. Vertebrae: No fracture, evidence of discitis, or bone lesion. Cord: No abnormal cord signal is evident within the limitations of this exam. Paraspinal and other soft tissues: Negative. Disc levels: T1-T2: Sagittal sequences only. At least mild bilateral foraminal stenosis. No canal stenosis evident. T2-T3: Disc osteophyte complex resulting in moderate left and mild right foraminal stenosis with mild canal stenosis. T3-T4: Disc osteophyte complex results in mild canal stenosis without foraminal stenosis. T4-T5: Disc osteophyte complex results in mild canal stenosis without foraminal stenosis. T5-T6: Disc osteophyte complex results in mild canal stenosis without foraminal stenosis. T6-T7: Prominent disc osteophyte complex resulting in moderate canal stenosis without significant foraminal stenosis. T7-T8: Disc osteophyte complex results in mild canal stenosis without foraminal stenosis. T8-T9: Left paracentral disc osteophyte complex results in mild left foraminal stenosis and mild canal stenosis. T9-T10: Disc osteophyte complex resulting in mild right greater than left foraminal stenosis with minimal canal stenosis. T10-T11: Disc osteophyte complex resulting in mild bilateral foraminal stenosis without canal stenosis. T11-T12: Diffuse disc osteophyte complex and mild  bilateral facet arthrosis results in severe right and moderate left foraminal stenosis without canal stenosis. IMPRESSION: 1. Extensive thoracic spondylosis with multiple levels of canal stenosis, most pronounced at T6-7 where a prominent disc osteophyte complex results in moderate canal stenosis. 2. Multiple levels of bilateral foraminal stenosis, most pronounced at T11-12 where there is severe right and moderate left foraminal stenosis. Electronically Signed   By: Duanne GuessNicholas  Plundo M.D.   On: 05/25/2019 11:23   Ct Abdomen Pelvis W Contrast  Result Date: 06/10/2019 CLINICAL DATA:  Clinical concern for groin infection and Fournier gangrene. EXAM: CT ABDOMEN AND PELVIS WITH CONTRAST TECHNIQUE: Multidetector CT imaging of the abdomen and pelvis was performed using the standard protocol following bolus administration of intravenous contrast. CONTRAST:  100mL OMNIPAQUE IOHEXOL 300 MG/ML  SOLN COMPARISON:  None. FINDINGS: Lower chest: There is mild right basilar atelectasis. Hepatobiliary: No focal liver abnormality is seen. No gallstones, gallbladder wall thickening, or biliary dilatation. Pancreas: Unremarkable. No pancreatic ductal dilatation or surrounding inflammatory changes. Spleen: Normal in size without focal abnormality. Adrenals/Urinary Tract: A left adrenal mass  measures 2.1 cm and measures less than 10 Hounsfield units, consistent with a benign adrenal adenoma or myelolipoma. Kidneys are normal, without renal calculi, focal lesion, or hydronephrosis. Bladder is unremarkable. Stomach/Bowel: Stomach is within normal limits. Appendix appears normal. No evidence of bowel wall thickening, distention, or inflammatory changes. Vascular/Lymphatic: No significant vascular findings are present. No enlarged abdominal lymph nodes. Reproductive: Prostate is unremarkable. Other: There is soft tissue edema and disorganized fluid with subcutaneous emphysema and overlying skin thickening involving the groin, concerning  for Fournier's gangrene. The penis and scrotum are incompletely imaged on this exam. Multiple enlarged bilateral inguinal and iliac chain lymph nodes are likely reactive. There is no involvement of the perianal region or retroperitoneal/intraperitoneal extension. There is no well-defined rim enhancing fluid collection to suggest abscess. No abdominal wall hernia. No abdominopelvic ascites. Musculoskeletal: No focal osseous demineralization to suggest osteomyelitis. Degenerative changes are seen in both hips and the lumbar spine. IMPRESSION: Soft tissue edema with subcutaneous emphysema and overlying skin thickening involving the groin is consistent with Fournier gangrene. Of note, the penis and scrotum are incompletely imaged. No well-defined fluid collection to suggest abscess. No involvement of the perianal region. No evidence of retroperitoneal or intraperitoneal extension. Bilateral inguinal and iliac chain lymph nodes are likely reactive. Electronically Signed   By: Zerita Boers M.D.   On: 06/10/2019 10:56     Lurline Del, DO 06/12/2019, 9:29 AM PGY-1, [place] Intern pager: [phone], text pages welcome

## 2019-06-12 NOTE — Progress Notes (Signed)
Initial Nutrition Assessment  DOCUMENTATION CODES:   Morbid obesity  INTERVENTION:   -Once diet is advanced, add:  -Ensure Max po BID, each supplement provides 150 kcal and 30 grams of protein -MVI with minerals daily -Provided "Carbohydrate Counting for People with Diabetes" and "Nutrition Labels: Carbohydrates" handouts from [facility] Nutrition Care Manual. Attached to AVS/ discharge summary -Referred pt to [facility] for outpatient diabetes education to further reinforce concepts  NUTRITION DIAGNOSIS:   Increased nutrient needs related to wound healing, post-op healing as evidenced by estimated needs.  GOAL:   Patient will meet greater than or equal to 90% of their needs  MONITOR:   PO intake, Supplement acceptance, Diet advancement, Labs, Weight trends, Skin, I & O's  REASON FOR ASSESSMENT:   Consult Diet education  ASSESSMENT:   Mario Little is a 43 y.o. male presenting with groin pain and penile swelling. PMH is significant for chronic back pain, bipolar 1, hypertension.  Pt admitted with groin abscess.   10/25- s/p Procedure: Incision and drainage of 10 cm suprapubic abscess; s/p cystoscopy  Per general surgery notes, pt NPO for repeat I&D of groin this afternoon.   Pt with new diagnosis of DM. Per MD coordinator note, pt will likely discharge home on insulin.   Pt sleeping soundly at time of visit and did not respond to voice. He is on IV dilaudid for pain control.   Pt with good appetite; noted meal completion 100%. Pt with increased nutritional needs for post-operative healing and would benefit from addition of nutritional supplements.   Reviewed wt hx; noted pt has experienced a 7.2% wt loss over the past 6 weeks, which is significant for time frame. Suspect weight loss is related to hyperglycemia due to newly diagnosed DM.   Lab Results  Component Value Date   HGBA1C 16.8 (H) 06/11/2019   PTA DM medications  are none.   Labs reviewed: K: 3.2 (on IV supplementation), CBGS: 228-239 (inpatient orders for glycemic control are 0-5 units insulin aspart q HS, 0-9 units insulin aspart TID with meals, and 20 units insulin glargine daily).   NUTRITION - FOCUSED PHYSICAL EXAM:    Most Recent Value  Orbital Region  No depletion  Upper Arm Region  No depletion  Thoracic and Lumbar Region  No depletion  Buccal Region  No depletion  Temple Region  No depletion  Clavicle Bone Region  No depletion  Clavicle and Acromion Bone Region  No depletion  Scapular Bone Region  No depletion  Dorsal Hand  No depletion  Patellar Region  No depletion  Anterior Thigh Region  No depletion  Posterior Calf Region  No depletion  Edema (RD Assessment)  Mild  Hair  Reviewed  Eyes  Reviewed  Mouth  Reviewed  Skin  Reviewed  Nails  Reviewed       Diet Order:   Diet Order            Diet NPO time specified  Diet effective now              EDUCATION NEEDS:   No education needs have been identified at this time  Skin:  Skin Assessment: Skin Integrity Issues: Skin Integrity Issues:: Incisions Incisions: closed penile incision  Last BM:  Unknown  Height:   Ht Readings from Last 1 Encounters:  06/10/19 6\' 1"  (1.854 m)    Weight:   Wt Readings from Last 1 Encounters:  06/10/19 (!) 147.4 kg  Ideal Body Weight:  83.6 kg  BMI:  Body mass index is 42.88 kg/m.  Estimated Nutritional Needs:   Kcal:  2100-2300  Protein:  125-140 grams  Fluid:  > 2.1 L    Kolter Reaver A. Mayford Knife, RD, LDN, CDCES Registered Dietitian II Certified Diabetes Care and Education Specialist Pager: [phone] After hours Pager: [phone]

## 2019-06-12 NOTE — Discharge Summary (Addendum)
[facility] Discharge Summary  Patient name: Mario Little Medical record number: [id number] Date of birth: 1976-05-23 Age: 43 y.o. Gender: male Date of Admission: 06/10/2019  Date of Discharge: 06/18/19  Admitting Physician: Zenia Resides, MD  Primary Care Provider: Patient, No Pcp Per Consultants: Surgery  Indication for Hospitalization: Groin and penile swelling secondary to abscess   Discharge Diagnoses/Problem List:  Active Problems:   Groin abscess   Type 2 diabetes mellitus with hyperglycemia ([place])   Hyperglycemia   Morbid obesity ([place])   Fournier's gangrene in male   Disposition: Home   Discharge Condition:  Improving  Discharge Exam:   GEN: obese male, in no acute distress  CV: regular rate and rhythm, no murmurs appreciated  RESP: no increased work of breathing, clear to ascultation bilaterally with no crackles, wheezes, or rhonchi  ABD: obese abdomen, bowel sounds present. Soft, non-tender, non-distended GU: penis swollen, suprapubic dressing clean dry and intact  MSK: no lower extremity edema, or cyanosis noted SKIN: warm, dry  NEURO: grossly normal, moves all extremities appropriately PSYCH: Normal affect and thought content   Brief Hospital Course:   Groin abscess:  Mario Little is a 43 y.o. male admitted with fever and redness/swelling of his groin, scrotum and inguinal region.  CT scan showed evidence of emphysema consistent with Fournier's gangrene and he was treated with Zosyn and vancomycin.  General surgery performed a surgical I&D of complex abscess on 10/25 and 10/28.  Surgery recommends patient to have antibiotic course for 14 days in which he received 8 days on his hospitalization.  Patient was transitioned to Augmentin and should complete this course on 06/24/2019.  Patient to have home health aide change dressings, in the outpatient setting.  New onset type 2 diabetes: On admission, patient was noted to  have a blood sugar level in around 360 with an A1c elevated to 16.8.  The patient had a family history of diabetes but states that he had never been diagnosed with diabetes before.  Patient was placed on 20 units of Lantus and started on sliding scale during hospitalization.  This Lantus was titrated up to 35 units of Lantus prior to discharge.  Patient was discharged with sliding scale NovoLog, Lantus 35 units, metformin XR and prescriptions for glucometer supplies.    Issues for Follow Up:  1. Patient to follow-up with [facility] regarding diabetic management. 2. Patient needs repeat TSH in 6 weeks due to low TSH (0.108)  here in hospital.  3. New medications at discharge include include:  Crestor 5 mg  Metformin 53m XR daily, titrate up to 1000 mg daily  NovoLog sliding scale  Lantus 35 units daily     Significant Procedures: Incision and drainage-10/25 Incision and drainage repeat - 10/28  Significant Labs and Imaging:  Recent Labs  Lab 06/16/19 0335 06/17/19 0804 06/18/19 0235  WBC 12.4* 12.7* 13.3*  HGB 11.6* 11.3* 11.4*  HCT 37.2* 36.3* 36.4*  PLT 315 314 328   Recent Labs  Lab 06/14/19 0234 06/15/19 0211 06/16/19 0335 06/17/19 0804 06/18/19 0235  NA 134* 140 138 138 138  K 3.7 3.3* 3.3* 3.5 3.9  CL 103 104 106 103 105  CO2 '22 25 22 25 25  ' GLUCOSE 258* 182* 122* 179* 180*  BUN '7 7 7 ' <5* 10  CREATININE 0.66 0.63 0.67 0.69 0.77  CALCIUM 7.9* 8.0* 8.0* 8.0* 8.1*      Results/Tests Pending at Time of Discharge: None  Discharge Medications:  Allergies as of 06/18/2019   No Known Allergies     Medication List    STOP taking these medications   doxycycline 100 MG capsule Commonly known as: VIBRAMYCIN   ibuprofen 800 MG tablet Commonly known as: ADVIL   oxyCODONE-acetaminophen 5-325 MG tablet Commonly known as: PERCOCET/ROXICET     TAKE these medications   Accu-Chek Aviva Plus w/Device Kit 1 kit by Does not apply route 4  (four) times daily -  before meals and at bedtime.   amoxicillin-clavulanate 875-125 MG tablet Commonly known as: AUGMENTIN Take 1 tablet by mouth 2 (two) times daily.   gabapentin 300 MG capsule Commonly known as: NEURONTIN Take 2 capsules (600 mg total) by mouth 3 (three) times daily.   insulin aspart 100 UNIT/ML injection Commonly known as: novoLOG Inject 0-15 Units into the skin three times daily with meals   insulin aspart 100 UNIT/ML injection Commonly known as: novoLOG Inject 0-5 U into skin at bedtime. CBG <70 0 U, CBG 70-200 0 U, CBG 201-250 2 U, CBG 251-300 3 U, CBG 301-250 4 U, CBG 351-400 5 U, >400call   insulin glargine 100 UNIT/ML injection Commonly known as: LANTUS Inject 0.35 mLs (35 Units total) into the skin daily. Start taking on: June 19, 2019   insulin starter kit- pen needles Misc 1 kit by Other route once for 1 dose.   loratadine 10 MG tablet Commonly known as: CLARITIN Take 1 tablet (10 mg total) by mouth daily. Start taking on: June 19, 2019   metFORMIN 500 MG 24 hr tablet Commonly known as: Glucophage XR Take 1 tablet (500 mg total) by mouth daily with breakfast. After the first week, take 2 tablets by mouth daily with breakfast.   nystatin powder Commonly known as: MYCOSTATIN/NYSTOP Apply topically 2 (two) times daily.   Oxycodone HCl 10 MG Tabs Take 1 tablet (10 mg total) by mouth every 6 (six) hours as needed for up to 5 days for severe pain.   rosuvastatin 5 MG tablet Commonly known as: CRESTOR Take 1 tablet (5 mg total) by mouth daily at 6 PM.   tiZANidine 4 MG tablet Commonly known as: ZANAFLEX Take 1 tablet (4 mg total) by mouth every 8 (eight) hours as needed.            Durable Medical Equipment  (From admission, onward)         Start     Ordered   06/18/19 1649  For home use only DME Walker rolling  Once    Comments: Bariatric  Question:  Patient needs a walker to treat with the following condition  Answer:   Weakness   06/18/19 1648   06/18/19 1649  For home use only DME Tub bench  Once    Comments: bariatric   06/18/19 1648          Discharge Instructions: Please refer to Patient Instructions section of EMR for full details.  Patient was counseled important signs and symptoms that should prompt return to medical care, changes in medications, dietary instructions, activity restrictions, and follow up appointments.   Follow-Up Appointments: Follow-up Information    [facility]. Schedule an appointment as soon as possible for a visit in 1 week(s).   Contact information: [address] [place] [address] [phone]        Erroll Luna, MD. Go on 07/09/2019.   Specialty: General Surgery Why: Your appointment is 11/23 at 11:50am Please arrive  30 minutes prior to your appointment to check in and fill out paperwork. Bring photo ID and insurance information. Contact information: [address] [place] [address] [phone]           Lyndee Hensen, MD 06/18/2019, 5:31 PM PGY-1, [place] ------------------------------------------------------------------------------------------------ Upper Level Addendum: I have seen and evaluated this patient along with Dr. Susa Simmonds and reviewed the above note, making necessary revisions in blue.  Guadalupe Dawn MD PGY-3 Family Medicine Resident

## 2019-06-12 NOTE — TOC Initial Note (Addendum)
Transition of Care [facility]) - Initial/Assessment Note    Patient Details  Name: Mario Little MRN: [id number] Date of Birth: [birth year]  [facility]) CM/SW Contact:    Marilu Favre, RN Phone Number: 06/12/2019, 10:30 AM  Clinical Narrative:                 Confirmed face sheet information with patient.   Patient does have [place] Medicaid. His PCP is with [employer] [facility] and [facility] [address] phone [phone].  Patient moved here from [place] in January 2020 , he was staying with his cousin but has since moved in with his father. Patient staying with his father and sleeping on his father's sofa.   Patient had started process of section 8 housing but due to covid it has been put on hold. Provided patient with some resources for [facility]. Patient also consented for NCM to enter him in [place] [facility].  Home health ordered. Explained to patient he and someone else will have to learn dressing changes. NCM will work on finding a home health agency who will accept Medicaid and have staff. NCM may not be able to arrange home health patient aware. Even if [facility] arranged patient and support person will have to learn dressing changes because home health will not make daily visits.  Patient voiced understanding to all of above.  Mateo Flow with [facility] checking to see if she can accept referral for home health. Mateo Flow unable to accept referral.   Tommi Rumps with [facility] checking to see if he can accept referral. Tommi Rumps unable to accept referral.  Dorian Pod with [facility] accepted referral.  Expected Discharge Plan: [place] Barriers to Discharge: Continued Medical Work up   Patient Goals and CMS Choice Patient states their goals for this hospitalization and ongoing recovery are:: to go home CMS Medicare.gov Compare Post Acute Care list provided to:: Patient Choice offered to / list presented to : Patient  Expected Discharge Plan  and Services Expected Discharge Plan: [place]   Discharge Planning Services: CM Consult Post Acute Care Choice: [place] arrangements for the past 2 months: Single Family Home                 DME Arranged: N/A         HH Arranged: RN          Prior Living Arrangements/Services Living arrangements for the past 2 months: Single Family Home Lives with:: Parents Patient language and need for interpreter reviewed:: Yes Do you feel safe going back to the place where you live?: Yes      Need for Family Participation in Patient Care: Yes (Comment) Care giver support system in place?: Yes (comment)   Criminal Activity/Legal Involvement Pertinent to Current Situation/Hospitalization: No - Comment as needed  Activities of Daily Living Home Assistive Devices/Equipment: None ADL Screening (condition at time of admission) Patient's cognitive ability adequate to safely complete daily activities?: Yes Is the patient deaf or have difficulty hearing?: No Does the patient have difficulty seeing, even when wearing glasses/contacts?: No Does the patient have difficulty concentrating, remembering, or making decisions?: No Patient able to express need for assistance with ADLs?: Yes Does the patient have difficulty dressing or bathing?: No Independently performs ADLs?: Yes (appropriate for developmental age) Does the patient have difficulty walking or climbing stairs?: Yes Weakness of Legs: Both Weakness of Arms/Hands: None  Permission Sought/Granted  Permission granted to share information with : Yes, Verbal Permission Granted     Permission granted to share info w AGENCY: No preference for home health        Emotional Assessment Appearance:: Appears stated age Attitude/Demeanor/Rapport: Engaged Affect (typically observed): Accepting Orientation: : Oriented to Self, Oriented to Place, Oriented to  Time, Oriented to Situation Alcohol / Substance Use: Not  Applicable Psych Involvement: No (comment)  Admission diagnosis:  Folliculitis Patient Active Problem List   Diagnosis Date Noted  . Fournier's gangrene 06/10/2019   PCP:  Patient, No Pcp Per Pharmacy:   [facility] [id number] - [place], [place] - [address] AT [facility] [address] [place] [address] Phone: [phone] Fax: [phone]  [id number] [place], [place] - [address] AT [facility] OF [place] [address] [address] Phone: [phone] Fax: [phone]  [facility] [place], [place] - [address] [place] [address] Phone: [phone] Fax: [phone]     Social Determinants of Health (SDOH) Interventions    Readmission Risk Interventions No flowsheet data found.

## 2019-06-13 ENCOUNTER — Inpatient Hospital Stay (HOSPITAL_COMMUNITY): Payer: Medicaid Other | Admitting: Certified Registered Nurse Anesthetist

## 2019-06-13 ENCOUNTER — Encounter (HOSPITAL_COMMUNITY): Payer: Self-pay | Admitting: Certified Registered"

## 2019-06-13 ENCOUNTER — Encounter (HOSPITAL_COMMUNITY)
Admission: EM | Disposition: A | Discharge status: Home Health | Payer: Self-pay | Source: Home / Self Care | Attending: Family Medicine

## 2019-06-13 DIAGNOSIS — N493 Fournier gangrene: Secondary | ICD-10-CM | POA: Diagnosis not present

## 2019-06-13 DIAGNOSIS — L02214 Cutaneous abscess of groin: Secondary | ICD-10-CM | POA: Diagnosis not present

## 2019-06-13 DIAGNOSIS — R739 Hyperglycemia, unspecified: Secondary | ICD-10-CM | POA: Diagnosis not present

## 2019-06-13 HISTORY — PX: IRRIGATION AND DEBRIDEMENT ABDOMEN: SHX6600

## 2019-06-13 LAB — BASIC METABOLIC PANEL
Anion gap: 10 (ref 5–15)
BUN: 8 mg/dL (ref 6–20)
CO2: 24 mmol/L (ref 22–32)
Calcium: 7.8 mg/dL — ABNORMAL LOW (ref 8.9–10.3)
Chloride: 101 mmol/L (ref 98–111)
Creatinine, Ser: 0.75 mg/dL (ref 0.61–1.24)
GFR calc Af Amer: >60 mL/min (ref >60)
GFR calc non Af Amer: >60 mL/min (ref >60)
Glucose, Bld: 254 mg/dL — ABNORMAL HIGH (ref 70–99)
Potassium: 3.3 mmol/L — ABNORMAL LOW (ref 3.5–5.1)
Sodium: 135 mmol/L (ref 135–145)

## 2019-06-13 LAB — CBC
HCT: 37.3 % — ABNORMAL LOW (ref 39.0–52.0)
Hemoglobin: 12.1 g/dL — ABNORMAL LOW (ref 13.0–17.0)
MCH: 26.2 pg (ref 26.0–34.0)
MCHC: 32.4 g/dL (ref 30.0–36.0)
MCV: 80.9 fL (ref 80.0–100.0)
Platelets: 298 10*3/uL (ref 150–400)
RBC: 4.61 MIL/uL (ref 4.22–5.81)
RDW: 14.4 % (ref 11.5–15.5)
WBC: 13.3 10*3/uL — ABNORMAL HIGH (ref 4.0–10.5)
nRBC: 0 % (ref 0.0–0.2)

## 2019-06-13 LAB — GLUCOSE, CAPILLARY
Glucose-Capillary: 210 mg/dL — ABNORMAL HIGH (ref 70–99)
Glucose-Capillary: 226 mg/dL — ABNORMAL HIGH (ref 70–99)
Glucose-Capillary: 291 mg/dL — ABNORMAL HIGH (ref 70–99)
Glucose-Capillary: 265 mg/dL — ABNORMAL HIGH (ref 70–99)
Glucose-Capillary: 235 mg/dL — ABNORMAL HIGH (ref 70–99)

## 2019-06-13 LAB — VANCOMYCIN, PEAK: Vancomycin Pk: 19 ug/mL — ABNORMAL LOW (ref 30–40)

## 2019-06-13 LAB — VANCOMYCIN, TROUGH: Vancomycin Tr: 7 ug/mL — ABNORMAL LOW (ref 15–20)

## 2019-06-13 SURGERY — IRRIGATION AND DEBRIDEMENT ABDOMEN
Anesthesia: General | Site: Abdomen

## 2019-06-13 MED ORDER — 0.9 % SODIUM CHLORIDE (POUR BTL) OPTIME
TOPICAL | Status: DC | PRN
  Administered 2019-06-13: 1000 mL

## 2019-06-13 MED ORDER — INSULIN GLARGINE 100 UNIT/ML ~~LOC~~ SOLN
5.0000 [IU] | Freq: Once | SUBCUTANEOUS | Status: AC
  Administered 2019-06-13: 5 [IU] via SUBCUTANEOUS
  Filled 2019-06-13: qty 0.05

## 2019-06-13 MED ORDER — OXYCODONE HCL 5 MG PO TABS: 5.0000 mg | ORAL_TABLET | Freq: Once | ORAL | Status: DC | PRN

## 2019-06-13 MED ORDER — FENTANYL CITRATE (PF) 100 MCG/2ML IJ SOLN
25.0000 ug | INTRAMUSCULAR | Status: DC | PRN
  Administered 2019-06-13 (×4): 50 ug via INTRAVENOUS

## 2019-06-13 MED ORDER — FENTANYL CITRATE (PF) 100 MCG/2ML IJ SOLN
INTRAMUSCULAR | Status: AC
  Filled 2019-06-13: qty 2

## 2019-06-13 MED ORDER — LACTATED RINGERS IV SOLN
INTRAVENOUS | Status: DC
  Administered 2019-06-13: 07:00:00 via INTRAVENOUS

## 2019-06-13 MED ORDER — INSULIN GLARGINE 100 UNIT/ML ~~LOC~~ SOLN
25.0000 [IU] | Freq: Every day | SUBCUTANEOUS | Status: DC
  Administered 2019-06-14: 25 [IU] via SUBCUTANEOUS
  Filled 2019-06-13 (×2): qty 0.25

## 2019-06-13 MED ORDER — DEXAMETHASONE SODIUM PHOSPHATE 10 MG/ML IJ SOLN
INTRAMUSCULAR | Status: DC | PRN
  Administered 2019-06-13: 5 mg via INTRAVENOUS

## 2019-06-13 MED ORDER — ACETAMINOPHEN 160 MG/5ML PO SOLN: 1000.0000 mg | Freq: Once | ORAL | Status: DC | PRN

## 2019-06-13 MED ORDER — FENTANYL CITRATE (PF) 250 MCG/5ML IJ SOLN
INTRAMUSCULAR | Status: DC | PRN
  Administered 2019-06-13 (×5): 50 ug via INTRAVENOUS

## 2019-06-13 MED ORDER — ACETAMINOPHEN 10 MG/ML IV SOLN
1000.0000 mg | Freq: Once | INTRAVENOUS | Status: DC | PRN
  Administered 2019-06-13: 1000 mg via INTRAVENOUS

## 2019-06-13 MED ORDER — PROPOFOL 10 MG/ML IV BOLUS
INTRAVENOUS | Status: AC
  Filled 2019-06-13: qty 20

## 2019-06-13 MED ORDER — LIDOCAINE 2% (20 MG/ML) 5 ML SYRINGE
INTRAMUSCULAR | Status: DC | PRN
  Administered 2019-06-13: 60 mg via INTRAVENOUS

## 2019-06-13 MED ORDER — ROSUVASTATIN CALCIUM 5 MG PO TABS
5.0000 mg | ORAL_TABLET | Freq: Every day | ORAL | Status: DC
  Administered 2019-06-13 – 2019-06-17 (×5): 5 mg via ORAL
  Filled 2019-06-13 (×5): qty 1

## 2019-06-13 MED ORDER — PROPOFOL 10 MG/ML IV BOLUS
INTRAVENOUS | Status: DC | PRN
  Administered 2019-06-13: 50 mg via INTRAVENOUS
  Administered 2019-06-13: 30 mg via INTRAVENOUS
  Administered 2019-06-13: 120 mg via INTRAVENOUS

## 2019-06-13 MED ORDER — ACETAMINOPHEN 500 MG PO TABS: 1000.0000 mg | ORAL_TABLET | Freq: Once | ORAL | Status: DC | PRN

## 2019-06-13 MED ORDER — MIDAZOLAM HCL 2 MG/2ML IJ SOLN
INTRAMUSCULAR | Status: DC | PRN
  Administered 2019-06-13: 2 mg via INTRAVENOUS

## 2019-06-13 MED ORDER — POTASSIUM CHLORIDE CRYS ER 20 MEQ PO TBCR
40.0000 meq | EXTENDED_RELEASE_TABLET | Freq: Once | ORAL | Status: AC
  Administered 2019-06-13: 11:00:00 40 meq via ORAL
  Filled 2019-06-13 (×2): qty 2

## 2019-06-13 MED ORDER — INSULIN ASPART 100 UNIT/ML ~~LOC~~ SOLN
0.0000 [IU] | Freq: Three times a day (TID) | SUBCUTANEOUS | Status: DC
  Administered 2019-06-13 (×2): 8 [IU] via SUBCUTANEOUS
  Administered 2019-06-14: 14:00:00 2 [IU] via SUBCUTANEOUS
  Administered 2019-06-14: 10:00:00 5 [IU] via SUBCUTANEOUS
  Administered 2019-06-14 – 2019-06-15 (×2): 3 [IU] via SUBCUTANEOUS
  Administered 2019-06-15: 08:00:00 2 [IU] via SUBCUTANEOUS
  Administered 2019-06-15: 3 [IU] via SUBCUTANEOUS
  Administered 2019-06-16: 2 [IU] via SUBCUTANEOUS
  Administered 2019-06-16 – 2019-06-17 (×3): 3 [IU] via SUBCUTANEOUS
  Administered 2019-06-17: 2 [IU] via SUBCUTANEOUS
  Administered 2019-06-18: 12:00:00 3 [IU] via SUBCUTANEOUS

## 2019-06-13 MED ORDER — HYDROMORPHONE HCL 1 MG/ML IJ SOLN
INTRAMUSCULAR | Status: AC
  Filled 2019-06-13: qty 1

## 2019-06-13 MED ORDER — SODIUM CHLORIDE 0.9 % IV SOLN
INTRAVENOUS | Status: AC
  Administered 2019-06-13 (×2): via INTRAVENOUS

## 2019-06-13 MED ORDER — MIDAZOLAM HCL 2 MG/2ML IJ SOLN
INTRAMUSCULAR | Status: AC
  Filled 2019-06-13: qty 2

## 2019-06-13 MED ORDER — ONDANSETRON HCL 4 MG/2ML IJ SOLN
INTRAMUSCULAR | Status: DC | PRN
  Administered 2019-06-13: 4 mg via INTRAVENOUS

## 2019-06-13 MED ORDER — OXYCODONE HCL 5 MG/5ML PO SOLN: 5.0000 mg | Freq: Once | ORAL | Status: DC | PRN

## 2019-06-13 MED ORDER — ACETAMINOPHEN 10 MG/ML IV SOLN
INTRAVENOUS | Status: AC
  Filled 2019-06-13: qty 100

## 2019-06-13 MED ORDER — FENTANYL CITRATE (PF) 250 MCG/5ML IJ SOLN
INTRAMUSCULAR | Status: AC
  Filled 2019-06-13: qty 5

## 2019-06-13 MED ORDER — LACTATED RINGERS IV SOLN
INTRAVENOUS | Status: DC | PRN
  Administered 2019-06-13: 07:00:00 via INTRAVENOUS

## 2019-06-13 SURGICAL SUPPLY — 25 items
BLADE CLIPPER SURG (BLADE) IMPLANT
BNDG GAUZE ELAST 4 BULKY (GAUZE/BANDAGES/DRESSINGS) IMPLANT
CANISTER SUCT 3000ML PPV (MISCELLANEOUS) ×3 IMPLANT
COVER SURGICAL LIGHT HANDLE (MISCELLANEOUS) ×3 IMPLANT
COVER WAND RF STERILE (DRAPES) ×3 IMPLANT
DRSG PAD ABDOMINAL 8X10 ST (GAUZE/BANDAGES/DRESSINGS) IMPLANT
ELECT CAUTERY BLADE 6.4 (BLADE) IMPLANT
ELECT REM PT RETURN 9FT ADLT (ELECTROSURGICAL) ×3
ELECTRODE REM PT RTRN 9FT ADLT (ELECTROSURGICAL) ×1 IMPLANT
GAUZE SPONGE 4X4 12PLY STRL (GAUZE/BANDAGES/DRESSINGS) IMPLANT
GLOVE BIO SURGEON STRL SZ7 (GLOVE) ×3 IMPLANT
GLOVE BIOGEL PI IND STRL 7.5 (GLOVE) ×1 IMPLANT
GLOVE BIOGEL PI INDICATOR 7.5 (GLOVE) ×2
GOWN STRL REUS W/ TWL LRG LVL3 (GOWN DISPOSABLE) ×2 IMPLANT
GOWN STRL REUS W/TWL LRG LVL3 (GOWN DISPOSABLE) ×6
KIT BASIN OR (CUSTOM PROCEDURE TRAY) ×3 IMPLANT
KIT TURNOVER KIT B (KITS) ×3 IMPLANT
NS IRRIG 1000ML POUR BTL (IV SOLUTION) ×3 IMPLANT
PACK GENERAL/GYN (CUSTOM PROCEDURE TRAY) ×3 IMPLANT
PAD ARMBOARD 7.5X6 YLW CONV (MISCELLANEOUS) ×3 IMPLANT
PENCIL SMOKE EVACUATOR (MISCELLANEOUS) ×3 IMPLANT
SWAB COLLECTION DEVICE MRSA (MISCELLANEOUS) IMPLANT
SWAB CULTURE ESWAB REG 1ML (MISCELLANEOUS) IMPLANT
TOWEL GREEN STERILE (TOWEL DISPOSABLE) ×3 IMPLANT
TOWEL GREEN STERILE FF (TOWEL DISPOSABLE) ×3 IMPLANT

## 2019-06-13 NOTE — Op Note (Addendum)
06/10/2019 - 06/13/2019  8:11 AM  PATIENT:  Mario Little  43 y.o. male  Patient Care Team: Patient, No Pcp Per as PCP - General (General Practice)  PRE-OPERATIVE DIAGNOSIS:  Suprapubic subcutaneous abscess complex/wound  POST-OPERATIVE DIAGNOSIS:  Same  PROCEDURE: 1. Incision and drainage of additional suprapubic abscesses x2 with: 2. Sharp debridement of right side of suprapubic wound (7 x 4 x 4 cm) 3. Sharp debridement of left side of suprapubic wound (7 x 3 x 4 cm)  SURGEON:  Sharon Mt. Maryl Blalock, MD  ANESTHESIA:   general  COUNTS:  Sponge, needle and instrument counts were reported correct x2 at the conclusion of the operation.  EBL: 20 mL  DRAINS: None  SPECIMEN: None  COMPLICATIONS: None  FINDINGS: Purulent drainage from right side and left side of suprapubic wound which had been opened 06/10/2019. The wound tracked further on both sides. On right right, the abscess cavity was opened and pus drained; additional 7 x 4 x 4 cm area of subcutaneous tissue was excised sharply which was necrotic. On the left, 7 x 3 x 4 cm of subcutaneous tissue excised which was not viable and necrotic. The base of the penis appeared without pus so further debridement here was not performed   DISPOSITION: PACU in satisfactory condition  INDICATION: Mario Little underwent I&D of suprapubic abscess by Dr. [name] 06/10/2019. Over the last 24-36hrs he has had ongoing purulent drainage from the lateral edges and further I&D/debridment was discussed. He opted to pursue this - please refer to notes elsewhere for details regarding this discussion.  DESCRIPTION: The patient was identified in preop holding and taken to the OR where he was placed on the operating room table. General endotracheal anesthesia was induced without difficulty. He was then prepped and draped in the usual sterile fashion. A surgical timeout was performed indicating the correct patient, procedure, positioning and need for  preoperative antibiotics.   Both the right and left side of the wound had active purulent drainage.  These were bluntly explored and there was devitalized tissue within these abscess cavities.  Approximately 20 cc of pus was drained from each side.  The overlying skin and nonviable subcutaneous tissue was then excised.  On the right this measured 7 x 4 x 4 cm (L x W x H) and extended into the subcutaneous tissue.  This debridement was accomplished sharply using a knife.  All devitalized tissue was removed.  There appeared to be adequate drainage at this point.  Attention was turned to the left side of the wound where 20 cc of pus had been evacuated.  This area was bluntly explored.  All loculations were broken free.  There was devitalized subcutaneous tissue which measured 7 x 3 x 4 cm.  This involves subcutaneous tissue.  This was sharply excised using a knife.  The rest of the wound was explored and there was no significant further purulent drainage.  There was some necrotic fat in the inferior aspect of the wound which was wiped free with a sponge.  There appeared to be no devitalized tissue towards the base of his penis but it does appear that his wound extends all the way down toward the base of his penis.  Should further debridement in this region ultimately be required, would certainly plan to have him seen with urology.  The entire wound bed was then irrigated with saline.  Hemostasis was achieved with electrocautery.  The wound was then packed with a moist Kerlix, 4 x  4's, and covered with an ABD pad secured with tape.  He was then awakened from anesthesia, extubated and transferred to a stretcher for transport to PACU in satisfactory condition

## 2019-06-13 NOTE — Progress Notes (Signed)
Family Medicine Teaching Service Daily Progress Note Intern Pager: [phone]  Patient name: Mario Little Medical record number: [id number] Date of birth: 1975/12/15 Age: 43 y.o. Gender: male  Primary Care Provider: Patient, No Pcp Per Consultants: General surgery, urology Code Status: Full  Pt Overview and Major Events to Date:  10/25-patient admitted 10/25-I&D and cystoscopy  Assessment and Plan: Mario Little is a 43 y.o. male presenting with groin pain and penile swelling. PMH is significant for chronic back pain, bipolar 1, hypertension.  Groin Abscess - vitals stable Status post surgical I&D.  WBC 13.3 no 10/28, improved from 14.0 on initial presentation.  CT scan showed soft tissue edema with subcutaneous emphysema and overlying skin thickening involving the greater groin which was consistent with Fournier gangrene.  -General surgery and urology on board, appreciate recommendation  -Plan is to take patient back surgery for additional debridement 10/28 -PRN oxy for pain and MiraLAX daily -Vancomycin, Zosyn as antibiotic regimen for ?Fournier's gangrene though seems more likely to be abscess at this point, -cultures pending. : wound cx showed mod strep B and mod gram neg rods (sus pending), urine cx was contaminated (can redraw if indicated), blood cx was NGx2days -PT/OT -Follow-up on urine and blood cultures -Glucose management as below  New onset-type 2 diabetes Patient currently on 20 units lantus with sSSI.  Blood glucose levels mid 200s. TSH decreased 0.108. Blood glucose on presentation of 336. Patient without history of type 2 diabetes.  Currently on no medications for this at home. Patient does have a history of type 2 diabetes in his father.  Urinalysis on presentation was positive for glucose of greater than 500. - Lantus 20units  -Sensitive sliding scale -On discharge will discuss oral medications  Low TSH: low at 0.108 -f/u outpatient, could be acute phase  reactant  Hypokalemia Potassium 3.3 am 10/28.   -kdur 40 -BMP in morning  Chronic back pain Home medications include Percocet 5-325 every 6 hours as needed, Zanaflex 4 mg as needed, gabapentin 600 mg 3 times daily -cont home oxy -Continue home gabapentin -Continue home Zanaflex as needed  Patient reported he is psych patient: Currently on no home medications.    Not on any home medications.  Unable to find further information via chart review.   -Continue to monitor  FEN/GI: N.p.o. pending second surgical debridement today. PPx: Heparin subq  Disposition: Likely home pending clinical work-up.  Subjective:  Patient was wheeling out to surgery during AM rounds, alert and said he had no questions/complaints.   Did say he was frustrated his morning CBG still >200.  Pleasant.  Objective: Temp:  [98.2 F (36.8 C)-99.5 F (37.5 C)] 98.7 F (37.1 C) (10/28 0557) Pulse Rate:  [75-105] 85 (10/28 0557) Resp:  [18-20] 18 (10/28 0557) BP: (116-144)/(73-86) 134/76 (10/28 0557) SpO2:  [94 %-100 %] 94 % (10/28 0557) Physical Exam: General: Alert and oriented, no distress, was able to competently answer questions appropriately but was wheeled out of the room to go to surgery so we were unable to do physical exam.  Laboratory: Recent Labs  Lab 06/11/19 0248 06/12/19 0723 06/13/19 0118  WBC 13.3* 12.2* 13.3*  HGB 11.6* 11.6* 12.1*  HCT 35.6* 35.7* 37.3*  PLT 244 270 298   Recent Labs  Lab 06/10/19 0823  06/11/19 0248 06/12/19 0723 06/13/19 0118  NA 135   < > 137 136 135  K 3.3*   < > 3.8 3.1* 3.3*  CL 100   < > 104 101  101  CO2 16*   < > 18* 24 24  BUN 5*   < > <5* 7 8  CREATININE 1.10   < > 0.89 0.82 0.75  CALCIUM 9.0   < > 8.3* 8.1* 7.8*  PROT 6.7  --   --   --   --   BILITOT 1.3*  --   --   --   --   ALKPHOS 114  --   --   --   --   ALT 18  --   --   --   --   AST 14*  --   --   --   --   GLUCOSE 353*   < > 300* 222* 254*   < > = values in this interval not  displayed.    Mr Thoracic Spine Wo Contrast  Result Date: 05/25/2019 CLINICAL DATA:  Mid back pain EXAM: MRI THORACIC SPINE WITHOUT CONTRAST TECHNIQUE: Multiplanar, multisequence MR imaging of the thoracic spine was performed. No intravenous contrast was administered. COMPARISON:  X-ray 04/30/2019.  Lumbar spine MRI 04/27/2019 FINDINGS: Technical note: Motion degraded examination, particularly affecting the axial sequences. Alignment:  Physiologic. Vertebrae: No fracture, evidence of discitis, or bone lesion. Cord: No abnormal cord signal is evident within the limitations of this exam. Paraspinal and other soft tissues: Negative. Disc levels: T1-T2: Sagittal sequences only. At least mild bilateral foraminal stenosis. No canal stenosis evident. T2-T3: Disc osteophyte complex resulting in moderate left and mild right foraminal stenosis with mild canal stenosis. T3-T4: Disc osteophyte complex results in mild canal stenosis without foraminal stenosis. T4-T5: Disc osteophyte complex results in mild canal stenosis without foraminal stenosis. T5-T6: Disc osteophyte complex results in mild canal stenosis without foraminal stenosis. T6-T7: Prominent disc osteophyte complex resulting in moderate canal stenosis without significant foraminal stenosis. T7-T8: Disc osteophyte complex results in mild canal stenosis without foraminal stenosis. T8-T9: Left paracentral disc osteophyte complex results in mild left foraminal stenosis and mild canal stenosis. T9-T10: Disc osteophyte complex resulting in mild right greater than left foraminal stenosis with minimal canal stenosis. T10-T11: Disc osteophyte complex resulting in mild bilateral foraminal stenosis without canal stenosis. T11-T12: Diffuse disc osteophyte complex and mild bilateral facet arthrosis results in severe right and moderate left foraminal stenosis without canal stenosis. IMPRESSION: 1. Extensive thoracic spondylosis with multiple levels of canal stenosis, most  pronounced at T6-7 where a prominent disc osteophyte complex results in moderate canal stenosis. 2. Multiple levels of bilateral foraminal stenosis, most pronounced at T11-12 where there is severe right and moderate left foraminal stenosis. Electronically Signed   By: Davina Poke M.D.   On: 05/25/2019 11:23   Ct Abdomen Pelvis W Contrast  Result Date: 06/10/2019 CLINICAL DATA:  Clinical concern for groin infection and Fournier gangrene. EXAM: CT ABDOMEN AND PELVIS WITH CONTRAST TECHNIQUE: Multidetector CT imaging of the abdomen and pelvis was performed using the standard protocol following bolus administration of intravenous contrast. CONTRAST:  161mL OMNIPAQUE IOHEXOL 300 MG/ML  SOLN COMPARISON:  None. FINDINGS: Lower chest: There is mild right basilar atelectasis. Hepatobiliary: No focal liver abnormality is seen. No gallstones, gallbladder wall thickening, or biliary dilatation. Pancreas: Unremarkable. No pancreatic ductal dilatation or surrounding inflammatory changes. Spleen: Normal in size without focal abnormality. Adrenals/Urinary Tract: A left adrenal mass measures 2.1 cm and measures less than 10 Hounsfield units, consistent with a benign adrenal adenoma or myelolipoma. Kidneys are normal, without renal calculi, focal lesion, or hydronephrosis. Bladder is unremarkable. Stomach/Bowel: Stomach is within normal limits.  Appendix appears normal. No evidence of bowel wall thickening, distention, or inflammatory changes. Vascular/Lymphatic: No significant vascular findings are present. No enlarged abdominal lymph nodes. Reproductive: Prostate is unremarkable. Other: There is soft tissue edema and disorganized fluid with subcutaneous emphysema and overlying skin thickening involving the groin, concerning for Fournier's gangrene. The penis and scrotum are incompletely imaged on this exam. Multiple enlarged bilateral inguinal and iliac chain lymph nodes are likely reactive. There is no involvement of the  perianal region or retroperitoneal/intraperitoneal extension. There is no well-defined rim enhancing fluid collection to suggest abscess. No abdominal wall hernia. No abdominopelvic ascites. Musculoskeletal: No focal osseous demineralization to suggest osteomyelitis. Degenerative changes are seen in both hips and the lumbar spine. IMPRESSION: Soft tissue edema with subcutaneous emphysema and overlying skin thickening involving the groin is consistent with Fournier gangrene. Of note, the penis and scrotum are incompletely imaged. No well-defined fluid collection to suggest abscess. No involvement of the perianal region. No evidence of retroperitoneal or intraperitoneal extension. Bilateral inguinal and iliac chain lymph nodes are likely reactive. Electronically Signed   By: Romona Curls M.D.   On: 06/10/2019 10:56     Marthenia Rolling, DO 06/13/2019, 6:20 AM PGY-3,  Family Medicine FPTS Intern pager: [phone], text pages welcome

## 2019-06-13 NOTE — Progress Notes (Signed)
Patient ID: Mario Little, male   DOB: February 18, 1976, 43 y.o.   MRN: [id number]    Day of Surgery  Subjective: Stable discomfort over wound. No complaints this morning otherwise. Denies n/v  Objective: Vital signs in last 24 hours: Temp:  [98.2 F (36.8 C)-99.5 F (37.5 C)] 98.7 F (37.1 C) (10/28 0557) Pulse Rate:  [75-105] 85 (10/28 0557) Resp:  [18-20] 18 (10/28 0557) BP: (116-144)/(73-86) 134/76 (10/28 0557) SpO2:  [94 %-100 %] 94 % (10/28 0557) Last BM Date: 06/12/19  Intake/Output from previous day: 10/27 0701 - 10/28 0700 In: 480 [P.O.:480] Out: 600 [Urine:600] Intake/Output this shift: No intake/output data recorded.  PE: Skin: stable drainage and appearance of wound today.  Right mons, inguinal area with induration and cellulitic changes. Some regression of erythema today    Lab Results:  Recent Labs    06/12/19 0723 06/13/19 0118  WBC 12.2* 13.3*  HGB 11.6* 12.1*  HCT 35.7* 37.3*  PLT 270 298   BMET Recent Labs    06/12/19 0723 06/13/19 0118  NA 136 135  K 3.1* 3.3*  CL 101 101  CO2 24 24  GLUCOSE 222* 254*  BUN 7 8  CREATININE 0.82 0.75  CALCIUM 8.1* 7.8*   PT/INR No results for input(s): LABPROT, INR in the last 72 hours. CMP     Component Value Date/Time   NA 135 06/13/2019 0118   K 3.3 (L) 06/13/2019 0118   CL 101 06/13/2019 0118   CO2 24 06/13/2019 0118   GLUCOSE 254 (H) 06/13/2019 0118   BUN 8 06/13/2019 0118   CREATININE 0.75 06/13/2019 0118   CALCIUM 7.8 (L) 06/13/2019 0118   PROT 6.7 06/10/2019 0823   ALBUMIN 2.6 (L) 06/10/2019 0823   AST 14 (L) 06/10/2019 0823   ALT 18 06/10/2019 0823   ALKPHOS 114 06/10/2019 0823   BILITOT 1.3 (H) 06/10/2019 0823   GFRNONAA >60 06/13/2019 0118   GFRAA >60 06/13/2019 0118   Lipase  No results found for: LIPASE     Studies/Results: No results found.  Anti-infectives: Anti-infectives (From admission, onward)   Start     Dose/Rate Route Frequency Ordered Stop   06/10/19 2200   [MAR Hold]  vancomycin (VANCOCIN) 1,500 mg in sodium chloride 0.9 % 500 mL IVPB     (MAR Hold since Wed 06/13/2019 at 0612.Hold Reason: Transfer to a Procedural area.)   1,500 mg 250 mL/hr over 120 Minutes Intravenous Every 12 hours 06/10/19 0913     06/10/19 1800  [MAR Hold]  piperacillin-tazobactam (ZOSYN) IVPB 3.375 g     (MAR Hold since Wed 06/13/2019 at 0612.Hold Reason: Transfer to a Procedural area.)   3.375 g 12.5 mL/hr over 240 Minutes Intravenous Every 8 hours 06/10/19 1128     06/10/19 1130  piperacillin-tazobactam (ZOSYN) IVPB 3.375 g     3.375 g 100 mL/hr over 30 Minutes Intravenous  Once 06/10/19 1128 06/10/19 1154   06/10/19 0845  vancomycin (VANCOCIN) IVPB 1000 mg/200 mL premix  Status:  Discontinued     1,000 mg 200 mL/hr over 60 Minutes Intravenous  Once 06/10/19 0833 06/10/19 0836   06/10/19 0845  cefTRIAXone (ROCEPHIN) 2 g in sodium chloride 0.9 % 100 mL IVPB     2 g 200 mL/hr over 30 Minutes Intravenous  Once 06/10/19 0833 06/10/19 0926   06/10/19 0845  clindamycin (CLEOCIN) IVPB 900 mg     900 mg 100 mL/hr over 30 Minutes Intravenous  Once 06/10/19 0834 06/10/19 0926  06/10/19 0845  vancomycin (VANCOCIN) 2,000 mg in sodium chloride 0.9 % 500 mL IVPB     2,000 mg 250 mL/hr over 120 Minutes Intravenous  Once 06/10/19 0836 06/10/19 1153       Assessment/Plan DM Chronic back pain  POD 3, s/p Incision and drainage of 10 cm suprapubic abscess 10/25 Dr. Luisa Hart cystoscopy per Dr. Berneice Heinrich - cultures shows group B strep -will return to OR today for washout and further debridement -The pathophysiology of groin abscesses and relevant anatomy was discussed at length with him again today. -We discussed incision/drainage and washout of wound with additional debridement -The planned procedure, material risks (including, but not limited to, pain, bleeding, infection, scarring, need for blood transfusion, damage to surrounding structures- blood vessels/nerves/viscus/organs,  need for additional procedures/washout, worsening of pre-existing medical conditions, recurrence, pneumonia, heart attack, stroke, death) benefits and alternatives to surgery were discussed at length. I noted a good probability that the procedure would help improve his symptoms. The patient's questions were answered to his satisfaction, he voiced understanding and elected to proceed with surgery. Additionally, we discussed typical postoperative expectations and the recovery process.  ID - zosyn/vancomycin 10/25>> FEN - NPO VTE - SCDs, sq heparin   LOS: 3 days   Stephanie Coup. Cliffton Asters, M.D. [facility]

## 2019-06-13 NOTE — Progress Notes (Signed)
Inpatient Diabetes Program Recommendations  AACE/ADA: New Consensus Statement on Inpatient Glycemic Control (2015)  Target Ranges:  Prepandial:   less than 140 mg/dL      Peak postprandial:   less than 180 mg/dL (1-2 hours)      Critically ill patients:  140 - 180 mg/dL   Results for ZEREK, LITSEY (MRN [id number]) as of 06/13/2019 08:36  Ref. Range 06/12/2019 07:49 06/12/2019 12:10 06/12/2019 14:17 06/12/2019 17:08 06/12/2019 22:14  Glucose-Capillary Latest Ref Range: 70 - 99 mg/dL 203 (H)  3 units NOVOLOG  158 (H)  2 units NOVOLOG +  10 units LANTUS  161 (H)    178 (H)  2 units NOVOLOG 279 (H)  3 units NOVOLOG    Results for CAYDAN, MCTAVISH (MRN [id number]) as of 06/13/2019 08:36  Ref. Range 06/13/2019 08:25  Glucose-Capillary Latest Ref Range: 70 - 99 mg/dL 226 (H)   5 mg Decadron given at 7:46am   Results for LIRON, EISSLER (MRN [id number]) as of 06/13/2019 08:36  Ref. Range 06/11/2019 02:48  Hemoglobin A1C Latest Ref Range: 4.8 - 5.6 % >15.5 (H)     Admit with: Groin Abscess/ Fourniergangrene/ New Diagnosis of Diabetes   Current Orders: Lantus 20 units Daily     Novolog Sensitive Correction Scale/ SSI (0-9 units) TID AC + HS    MD- Please consider the following in-hospital insulin adjustments:  1. Increase Lantus to 24 units Daily (20% increase)--AM CBG still >200 mg/dl  2. Increase Novolog SSi to Moderate scale (0-15 units) TID AC + HS  3. Given A1c on presentation and need for tight glucose control for healing after discharge, may benefit pt to d/c home on Insulin.  Has been counseled about taking insulin and should be practicing with the nursing staff.  Was shown the Insulin pen and how to use insulin pens as well.  If you decide to send home on insulin, please give patient Rxs for: 1. Insulin Pens (Lantus, Novolog) 2. Insulin Pen Needles 3. Glucometer Kit     --Will follow patient during hospitalization--  Wyn Quaker  RN, MSN, CDE Diabetes Coordinator Inpatient Glycemic Control Team Team Pager: [phone] (8a-5p)

## 2019-06-13 NOTE — Anesthesia Procedure Notes (Signed)
Procedure Name: LMA Insertion Date/Time: 06/13/2019 7:42 AM Performed by: Gaylene Brooks, CRNA Pre-anesthesia Checklist: Patient identified, Emergency Drugs available, Suction available and Patient being monitored Patient Re-evaluated:Patient Re-evaluated prior to induction Oxygen Delivery Method: Circle System Utilized Preoxygenation: Pre-oxygenation with 100% oxygen Induction Type: IV induction Ventilation: Mask ventilation without difficulty LMA: LMA inserted LMA Size: 5.0 Number of attempts: 1 Airway Equipment and Method: Bite block Placement Confirmation: positive ETCO2 Tube secured with: Tape Dental Injury: Teeth and Oropharynx as per pre-operative assessment

## 2019-06-13 NOTE — TOC Progression Note (Signed)
Transition of Care [facility] [place] [facility]) - Progression Note    Patient Details  Name: Mario Little MRN: [id number] Date of Birth: [birth year]  Transition of Care [facility] CM/SW [place], [place] Phone Number: 06/13/2019, 3:02 PM  Clinical Narrative:    CSW acknowledging consult for "homeless issues."  Pt has been staying at his fathers on the couch, he was given resources, referral also made via [place]. TOC team cannot expedite [employer] voucher process nor is there any other immediate housing placements available. At current time pt referral was accepted for [facility] through [facility].    Expected Discharge Plan: [place] Barriers to Discharge: Continued Medical Work up  Expected Discharge Plan and Services Expected Discharge Plan: [place]  Discharge Planning Services: CM Consult Post Acute Care Choice: [place] arrangements for the past 2 months: Single Family Home         DME Arranged: N/A HH Arranged: RN   Social Determinants of Health (SDOH) Interventions    Readmission Risk Interventions No flowsheet data found.

## 2019-06-13 NOTE — Progress Notes (Signed)
Visited patient while on rounds. Patient did not require any spiritual care at the time, but I will remain available if and when needed.  Rev. Berkley.

## 2019-06-13 NOTE — Transfer of Care (Signed)
Immediate Anesthesia Transfer of Care Note  Patient: Mario Little  Procedure(s) Performed: IRRIGATION AND DEBRIDEMENT MONS ABSCESS (N/A Abdomen)  Patient Location: PACU  Anesthesia Type:General  Level of Consciousness: awake, alert  and oriented  Airway & Oxygen Therapy: Patient Spontanous Breathing and Patient connected to face mask oxygen  Post-op Assessment: Report given to RN, Post -op Vital signs reviewed and stable and Patient moving all extremities X 4  Post vital signs: Reviewed and stable  Last Vitals:  Vitals Value Taken Time  BP    Temp    Pulse 90 06/13/19 0822  Resp 14 06/13/19 0822  SpO2 100 % 06/13/19 0822  Vitals shown include unvalidated device data.  Last Pain:  Vitals:   06/13/19 0655  TempSrc:   PainSc: 7       Patients Stated Pain Goal: 2 (89/38/10 1751)  Complications: No apparent anesthesia complications

## 2019-06-13 NOTE — Progress Notes (Signed)
Brief Nutrition Education Follow-Up Note  Refer to RD note dated on 06/12/19; RD attempted to provide diet education yesterday, however, pt too lethargic to participate.   Attempted to visit pt again, however, pt sleeping at time of visit. He did not respond to name being called. Noted pt s/p PROCEDURE: Incision and drainage of additional suprapubic abscesses x2 with: Sharp debridement of right side of suprapubic wound (7 x 4 x 4 cm) and Sharp debridement of left side of suprapubic wound (7 x 3 x 4 cm) earlier this AM.   RD has already been provided handouts and been referred to outpatient diabetes education through [place]. RD will attempt to re-visit pt for education needs while pt is still in the hospital. RD will continue to follow for acute nutritional needs.   Wang [place] A. Jimmye Norman, RD, LDN, [place] Registered Dietitian II Certified Diabetes Care and Education Specialist Pager: [phone] After hours Pager: [phone]

## 2019-06-13 NOTE — Anesthesia Preprocedure Evaluation (Signed)
Anesthesia Evaluation  Patient identified by MRN, date of birth, ID band Patient awake    Reviewed: Allergy & Precautions, NPO status , Patient's Chart, lab work & pertinent test results  History of Anesthesia Complications Negative for: history of anesthetic complications  Airway Mallampati: III  TM Distance: >3 FB Neck ROM: Full    Dental  (+) Poor Dentition, Dental Advisory Given   Pulmonary neg recent URI, Current Smoker and Patient abstained from smoking.,    breath sounds clear to auscultation       Cardiovascular hypertension,  Rhythm:Regular     Neuro/Psych PSYCHIATRIC DISORDERS Anxiety Bipolar Disorder negative neurological ROS     GI/Hepatic negative GI ROS, Neg liver ROS,   Endo/Other  diabetesMorbid obesity  Renal/GU negative Renal ROS     Musculoskeletal negative musculoskeletal ROS (+)   Abdominal   Peds  Hematology  (+) anemia ,   Anesthesia Other Findings   Reproductive/Obstetrics                             Anesthesia Physical Anesthesia Plan  ASA: III  Anesthesia Plan: General   Post-op Pain Management:    Induction: Intravenous  PONV Risk Score and Plan: 1 and Treatment may vary due to age or medical condition and Ondansetron  Airway Management Planned: LMA  Additional Equipment: None  Intra-op Plan:   Post-operative Plan: Extubation in OR  Informed Consent: I have reviewed the patients History and Physical, chart, labs and discussed the procedure including the risks, benefits and alternatives for the proposed anesthesia with the patient or authorized representative who has indicated his/her understanding and acceptance.     Dental advisory given  Plan Discussed with: CRNA and Surgeon  Anesthesia Plan Comments:         Anesthesia Quick Evaluation

## 2019-06-14 ENCOUNTER — Encounter (HOSPITAL_COMMUNITY): Payer: Self-pay | Admitting: Surgery

## 2019-06-14 DIAGNOSIS — R739 Hyperglycemia, unspecified: Secondary | ICD-10-CM | POA: Diagnosis not present

## 2019-06-14 DIAGNOSIS — L02214 Cutaneous abscess of groin: Principal | ICD-10-CM

## 2019-06-14 DIAGNOSIS — E1165 Type 2 diabetes mellitus with hyperglycemia: Secondary | ICD-10-CM | POA: Diagnosis not present

## 2019-06-14 LAB — BASIC METABOLIC PANEL
Anion gap: 9 (ref 5–15)
BUN: 7 mg/dL (ref 6–20)
CO2: 22 mmol/L (ref 22–32)
Calcium: 7.9 mg/dL — ABNORMAL LOW (ref 8.9–10.3)
Chloride: 103 mmol/L (ref 98–111)
Creatinine, Ser: 0.66 mg/dL (ref 0.61–1.24)
GFR calc Af Amer: >60 mL/min (ref >60)
GFR calc non Af Amer: >60 mL/min (ref >60)
Glucose, Bld: 258 mg/dL — ABNORMAL HIGH (ref 70–99)
Potassium: 3.7 mmol/L (ref 3.5–5.1)
Sodium: 134 mmol/L — ABNORMAL LOW (ref 135–145)

## 2019-06-14 LAB — CBC
HCT: 36.3 % — ABNORMAL LOW (ref 39.0–52.0)
Hemoglobin: 11.8 g/dL — ABNORMAL LOW (ref 13.0–17.0)
MCH: 26.2 pg (ref 26.0–34.0)
MCHC: 32.5 g/dL (ref 30.0–36.0)
MCV: 80.7 fL (ref 80.0–100.0)
Platelets: 295 10*3/uL (ref 150–400)
RBC: 4.5 MIL/uL (ref 4.22–5.81)
RDW: 14.3 % (ref 11.5–15.5)
WBC: 15 10*3/uL — ABNORMAL HIGH (ref 4.0–10.5)
nRBC: 0 % (ref 0.0–0.2)

## 2019-06-14 LAB — GLUCOSE, CAPILLARY
Glucose-Capillary: 242 mg/dL — ABNORMAL HIGH (ref 70–99)
Glucose-Capillary: 247 mg/dL — ABNORMAL HIGH (ref 70–99)
Glucose-Capillary: 197 mg/dL — ABNORMAL HIGH (ref 70–99)
Glucose-Capillary: 247 mg/dL — ABNORMAL HIGH (ref 70–99)

## 2019-06-14 LAB — AEROBIC/ANAEROBIC CULTURE W GRAM STAIN (SURGICAL/DEEP WOUND)

## 2019-06-14 MED ORDER — SODIUM CHLORIDE 0.9 % IV SOLN
3.0000 g | Freq: Four times a day (QID) | INTRAVENOUS | Status: DC
  Administered 2019-06-14 – 2019-06-18 (×16): 3 g via INTRAVENOUS
  Filled 2019-06-14: qty 8
  Filled 2019-06-14 (×3): qty 3
  Filled 2019-06-14: qty 8
  Filled 2019-06-14 (×6): qty 3
  Filled 2019-06-14 (×2): qty 8
  Filled 2019-06-14: qty 3
  Filled 2019-06-14: qty 8
  Filled 2019-06-14 (×3): qty 3
  Filled 2019-06-14: qty 8

## 2019-06-14 MED ORDER — ACETAMINOPHEN 500 MG PO TABS
1000.0000 mg | ORAL_TABLET | Freq: Four times a day (QID) | ORAL | Status: DC
  Administered 2019-06-14 – 2019-06-18 (×16): 1000 mg via ORAL
  Filled 2019-06-14 (×17): qty 2

## 2019-06-14 MED ORDER — POLYETHYLENE GLYCOL 3350 17 G PO PACK: 17.0000 g | PACK | Freq: Every day | ORAL | Status: DC | PRN

## 2019-06-14 MED ORDER — INSULIN GLARGINE 100 UNIT/ML ~~LOC~~ SOLN
10.0000 [IU] | Freq: Once | SUBCUTANEOUS | Status: AC
  Administered 2019-06-14: 10 [IU] via SUBCUTANEOUS
  Filled 2019-06-14: qty 0.1

## 2019-06-14 NOTE — Progress Notes (Signed)
Inpatient Diabetes Program Recommendations  AACE/ADA: New Consensus Statement on Inpatient Glycemic Control   Target Ranges:  Prepandial:   less than 140 mg/dL      Peak postprandial:   less than 180 mg/dL (1-2 hours)      Critically ill patients:  140 - 180 mg/dL  Results for SHAE, AUGELLO (MRN [id number]) as of 06/14/2019 14:07  Ref. Range 06/13/2019 08:25 06/13/2019 12:55 06/13/2019 16:20 06/13/2019 20:25 06/14/2019 07:59 06/14/2019 12:07  Glucose-Capillary Latest Ref Range: 70 - 99 mg/dL 226 (H) 291 (H) 265 (H) 235 (H) 242 (H) 247 (H)   Results for BLANCHARD, WILLHITE (MRN [id number]) as of 06/14/2019 14:07  Ref. Range 06/11/2019 02:48  Hemoglobin A1C Latest Ref Range: 4.8 - 5.6 % >15.5 (H)   Review of Glycemic Control  Diabetes history: NO Outpatient Diabetes medications: NA Current orders for Inpatient glycemic control: Lantus 25 units daily, NOvolog 0-15 units TID with meals, Novolog 0-5 units QHS, Lantus 10 units x1 today  NOTE: Followed up with patient regarding new DM dx and to review insulin. Patient reports that he has been reading DM and insulin education material and he feels he is trying to absorb everything. Patient states that he recently moved to [place] and he is currently living with his father and his father has DM and uses insulin. Patient notes that his glucose has continued to fluctuate and he does not understand why. Explained that when starting a patient new to insulin the dose is started low and then adjusted based on glycemic trends while inpatient. Also explained how stress, infection, and pain can impact glucose as well. Reviewed Lantus and Novolog and how they are currently ordered. Patient states that he would prefer to use insulin pens as an outpatient if discharged new to insulin (Diabetes Coordinator had already reviewed insulin pen during previous visit with patient). Had patient demonstrate how to use an insulin pen which he did successfully. Discussed  hypoglycemia and patient states that he has seen his father experience hypoglycemia so he is aware how it may feel and how to treat.  Patient states that he does not have a PCP and would like to make sure he will have a provider to follow up with following discharge regarding DM.  Informed patient that CM would be consulted to assist with follow up. Patient states that he knows he will need to make dietary changes and RD has talked with him about Carb Modified diet and some changes he can make. Patient is very motivated to get DM controlled and be healthy. Patient verbalized understanding of information discussed and states that he has no questions at this time related to DM.  If patient is discharged on insulin please provide Rx for:  1. Insulin Pens (Lantus SoloStar # [id number], Novolog FlexPen # [id number]) 2. Insulin Pen Needles (# [id number]) 3. Glucometer Kit [phone]# [id number])  Thanks, Barnie Alderman, RN, MSN, CDE Diabetes Coordinator Inpatient Diabetes Program [phone] (Team Pager from 8am to 5pm)

## 2019-06-14 NOTE — Progress Notes (Signed)
Patient ID: Mario Little, male   DOB: [birth year], 43 y.o.   MRN: [id number]    1 Day Post-Op  Subjective: Pain is somewhat less today after repeat operation yesterday.  No issues urinating.  ROS: See above, otherwise other systems negative  Objective: Vital signs in last 24 hours: Temp:  [97.8 F (36.6 C)-98.4 F (36.9 C)] 98.4 F (36.9 C) (10/29 0900) Pulse Rate:  [64-96] 96 (10/29 0900) Resp:  [18-20] 19 (10/29 0424) BP: (129-145)/(77-93) 138/79 (10/29 0900) SpO2:  [98 %-100 %] 98 % (10/29 0900) Last BM Date: 06/13/19  Intake/Output from previous day: 10/28 0701 - 10/29 0700 In: 3010.3 [P.O.:480; I.V.:2275.6; IV Piggyback:254.7] Out: 2350 [Urine:2300; Blood:50] Intake/Output this shift: Total I/O In: -  Out: 550 [Urine:550]  PE: Skin: wound is 100% beefy red and clean.  Minimal erythema to right of wound, but no further purulent drainage noted.  Lab Results:  Recent Labs    06/13/19 0118 06/14/19 0234  WBC 13.3* 15.0*  HGB 12.1* 11.8*  HCT 37.3* 36.3*  PLT 298 295   BMET Recent Labs    06/13/19 0118 06/14/19 0234  NA 135 134*  K 3.3* 3.7  CL 101 103  CO2 24 22  GLUCOSE 254* 258*  BUN 8 7  CREATININE 0.75 0.66  CALCIUM 7.8* 7.9*   PT/INR No results for input(s): LABPROT, INR in the last 72 hours. CMP     Component Value Date/Time   NA 134 (L) 06/14/2019 0234   K 3.7 06/14/2019 0234   CL 103 06/14/2019 0234   CO2 22 06/14/2019 0234   GLUCOSE 258 (H) 06/14/2019 0234   BUN 7 06/14/2019 0234   CREATININE 0.66 06/14/2019 0234   CALCIUM 7.9 (L) 06/14/2019 0234   PROT 6.7 06/10/2019 0823   ALBUMIN 2.6 (L) 06/10/2019 0823   AST 14 (L) 06/10/2019 0823   ALT 18 06/10/2019 0823   ALKPHOS 114 06/10/2019 0823   BILITOT 1.3 (H) 06/10/2019 0823   GFRNONAA >60 06/14/2019 0234   GFRAA >60 06/14/2019 0234   Lipase  No results found for: LIPASE     Studies/Results: No results found.  Anti-infectives: Anti-infectives (From admission, onward)    Start     Dose/Rate Route Frequency Ordered Stop   06/10/19 2200  vancomycin (VANCOCIN) 1,500 mg in sodium chloride 0.9 % 500 mL IVPB  Status:  Discontinued     1,500 mg 250 mL/hr over 120 Minutes Intravenous Every 12 hours 06/10/19 0913 06/13/19 1117   06/10/19 1800  piperacillin-tazobactam (ZOSYN) IVPB 3.375 g     3.375 g 12.5 mL/hr over 240 Minutes Intravenous Every 8 hours 06/10/19 1128     06/10/19 1130  piperacillin-tazobactam (ZOSYN) IVPB 3.375 g     3.375 g 100 mL/hr over 30 Minutes Intravenous  Once 06/10/19 1128 06/10/19 1154   06/10/19 0845  vancomycin (VANCOCIN) IVPB 1000 mg/200 mL premix  Status:  Discontinued     1,000 mg 200 mL/hr over 60 Minutes Intravenous  Once 06/10/19 0833 06/10/19 0836   06/10/19 0845  cefTRIAXone (ROCEPHIN) 2 g in sodium chloride 0.9 % 100 mL IVPB     2 g 200 mL/hr over 30 Minutes Intravenous  Once 06/10/19 0833 06/10/19 0926   06/10/19 0845  clindamycin (CLEOCIN) IVPB 900 mg     900 mg 100 mL/hr over 30 Minutes Intravenous  Once 06/10/19 0834 06/10/19 0926   06/10/19 0845  vancomycin (VANCOCIN) 2,000 mg in sodium chloride 0.9 % 500 mL IVPB  2,000 mg 250 mL/hr over 120 Minutes Intravenous  Once 06/10/19 0836 06/10/19 1153       Assessment/Plan DM Chronic back pain  POD 4,1, s/p Incision and drainage of 10 cm suprapubic abscess10/25 Dr. Luisa Hart cystoscopy per Dr. Berneice Heinrich, repeat I&D Dr. Cliffton Asters 10/28 - cultures shows group B strep - wound much better today after further debridement.  WBC up to 15K from 13 as expected postoperatively -cont abx therapy -BID dressing changes -pain control with dressing changes is going to be a struggle for this patient  ID -zosyn10/25>> FEN -carb mod diet VTE -SCDs, sq heparin   LOS: 4 days    Letha Cape , [facility] 06/14/2019, 10:20 AM Please see Amion for pager number during day hours 7:00am-4:30pm

## 2019-06-14 NOTE — Progress Notes (Signed)
Pharmacy Antibiotic Note  Mario Little is a 43 y.o. male admitted on 06/10/2019 with sepsis and cellulitis. Pt recently s/p I&D on 10/25 & 10/28. Pt previously on vancomycin 10/25>10/28 and Zosyn 10/25>10/29   Gram stain of wound culture showed moderate gram positive cocci, gram negative rods, few gram positive rods. Wound cultures grew group B Strep.  Pt currently afebrile,WBC 15.0. HR and BP wnl.   Pharmacy has been consulted for Unasyn dosing.   Plan: Unasyn 3g IV every 6 hours. Monitor WBC, fever, and for signs and symptoms of systemic infection and clinical improvement.  Height: 6\' 1"  (185.4 cm) Weight: (!) 325 lb (147.4 kg) IBW/kg (Calculated) : 79.9   Temp (24hrs), Avg:98.1 F (36.7 C), Min:97.8 F (36.6 C), Max:98.4 F (36.9 C) Estimated Creatinine Clearance: 183.7 mL/min (by C-G formula based on SCr of 0.66 mg/dL).    No Known Allergies  Onnie Boer; PharmD Candidate 06/14/2019 12:19 PM

## 2019-06-14 NOTE — Evaluation (Signed)
Physical Therapy Evaluation Patient Details Name: Mario Little MRN: [id number] DOB: 17-Jan-1976 Today's Date: 06/14/2019   History of Present Illness  43yo male with groin pain and penile edema, CT consistent with Fournier's gangrene. Received I&D of mons on 10/25 and 10/29. PMH PTDS, multiple personality disorder, intermittent explosive disorder, HTN, DM, bipolar, chonic back pain  Clinical Impression   Patient received in bed, pleasant and motivated to participate in PT evaluation today but reporting high levels of pain due to wound in groin. Able to complete bed mobility with Mod(I) and extended time, functional transfers with MinA to stabilize RW, and gait approximately 57f with RW and S. Note significant postural deviations including rigid and flexed lumbar spine, bilateral hip IR,and bilateral knee valgus also impacting gait pattern. Multiple rest breaks taken in standing, gait very effortful. He was left in bed with all needs met and questions/concerns addressed today. Currently recommending skilled HHPT services moving forward.     Follow Up Recommendations Home health PT    Equipment Recommendations  Rolling walker with 5" wheels;3in1 (PT)(bariatric equipment)    Recommendations for Other Services       Precautions / Restrictions Precautions Precautions: Fall;Other (comment) Precaution Comments: multiple mental illnesses currently not on medication, chronic severe hip and LBP Restrictions Weight Bearing Restrictions: No      Mobility  Bed Mobility Overal bed mobility: Modified Independent                Transfers Overall transfer level: Needs assistance Equipment used: Rolling walker (2 wheeled) Transfers: Sit to/from Stand Sit to Stand: Min assist         General transfer comment: MinA to stabilize RW, no physical assist given  Ambulation/Gait Ambulation/Gait assistance: Supervision Gait Distance (Feet): 70 Feet Assistive device: Rolling walker (2  wheeled) Gait Pattern/deviations: Step-to pattern;Decreased step length - right;Decreased step length - left;Decreased dorsiflexion - right;Decreased dorsiflexion - left;Antalgic;Trunk flexed;Wide base of support Gait velocity: decreased   General Gait Details: slow and steady at self-selected pace with bari-RW, short steps/stride lengths and flexed trunk. Multiple standing rest breaks due to pain  Stairs            Wheelchair Mobility    Modified Rankin (Stroke Patients Only)       Balance Overall balance assessment: Mild deficits observed, not formally tested                                           Pertinent Vitals/Pain Pain Assessment: Faces Faces Pain Scale: Hurts whole lot Pain Location: groin, back, hips Pain Descriptors / Indicators: Aching;Grimacing;Sharp Pain Intervention(s): Limited activity within patient's tolerance;Monitored during session;Premedicated before session    Home Living Family/patient expects to be discharged to:: Private residence Living Arrangements: Parent Available Help at Discharge: Family;Available PRN/intermittently Type of Home: House Home Access: Stairs to enter Entrance Stairs-Rails: Can reach both Entrance Stairs-Number of Steps: 4 Home Layout: One level Home Equipment: None      Prior Function Level of Independence: Independent               Hand Dominance        Extremity/Trunk Assessment   Upper Extremity Assessment Upper Extremity Assessment: Overall WFL for tasks assessed    Lower Extremity Assessment Lower Extremity Assessment: Overall WFL for tasks assessed    Cervical / Trunk Assessment Cervical / Trunk Assessment: Normal  Communication  Communication: No difficulties  Cognition Arousal/Alertness: Awake/alert Behavior During Therapy: WFL for tasks assessed/performed Overall Cognitive Status: Within Functional Limits for tasks assessed                                  General Comments: very motivated to participate with PT      General Comments General comments (skin integrity, edema, etc.): able to easily maintain balance in dynamic and static situations with min guard and no UE support    Exercises     Assessment/Plan    PT Assessment Patient needs continued PT services  PT Problem List Obesity;Decreased activity tolerance;Pain;Decreased mobility       PT Treatment Interventions DME instruction;Balance training;Gait training;Neuromuscular re-education;Stair training;Functional mobility training;Patient/family education;Therapeutic activities;Therapeutic exercise    PT Goals (Current goals can be found in the Care Plan section)  Acute Rehab PT Goals Patient Stated Goal: less pain, wound to heal PT Goal Formulation: With patient Time For Goal Achievement: 06/28/19 Potential to Achieve Goals: Good    Frequency Min 3X/week   Barriers to discharge        Co-evaluation               AM-PAC PT "6 Clicks" Mobility  Outcome Measure Help needed turning from your back to your side while in a flat bed without using bedrails?: A Little Help needed moving from lying on your back to sitting on the side of a flat bed without using bedrails?: A Little Help needed moving to and from a bed to a chair (including a wheelchair)?: A Little Help needed standing up from a chair using your arms (e.g., wheelchair or bedside chair)?: A Little Help needed to walk in hospital room?: A Little Help needed climbing 3-5 steps with a railing? : A Lot 6 Click Score: 17    End of Session   Activity Tolerance: Patient tolerated treatment well Patient left: in bed;with call bell/phone within reach Nurse Communication: Mobility status PT Visit Diagnosis: Difficulty in walking, not elsewhere classified (R26.2);Pain Pain - Right/Left: (groin, B hips, back) Pain - part of body: (groin, B hips, back)    Time: 8250-5397 PT Time Calculation (min) (ACUTE ONLY):  25 min   Charges:   PT Evaluation $PT Eval Moderate Complexity: 1 Mod PT Treatments $Gait Training: 8-22 mins        Deniece Ree PT, DPT, CBIS  Supplemental Physical Therapist [place]    Pager [phone] Acute Rehab Office [phone]

## 2019-06-14 NOTE — Progress Notes (Addendum)
Family Medicine Teaching Service Daily Progress Note Intern Pager: [phone]  Patient name: DAVINDER HAFF Medical record number: [id number] Date of birth: [birth year] Age: 43 y.o. Gender: male  Primary Care Provider: Patient, No Pcp Per Consultants: General surgery, urology Code Status: Full  Pt Overview and Major Events to Date:  10/25-patient admitted 10/25-I&D and cystoscopy  Assessment and Plan: Danh HAKEEN SHIPES is a 43 y.o. male presenting with groin pain and penile swelling. PMH is significant for chronic back pain, bipolar 1, hypertension.  Groin Abscess - vitals stable Status post 2nd surgical I&D.  WBC 13.3 no 10/28, now 15.0 10/29.  CT scan showed soft tissue edema with subcutaneous emphysema and overlying skin thickening involving the greater groin which was consistent with Fournier gangrene.  -General surgery and urology on board, appreciate recommendation -PRN oxy for pain and MiraLAX daily -IV Zosyn as antibiotic regimen for ?Fournier's gangrene though seems more likely to be abscess at this point -cultures pending. : wound cx showed mod strep B and mod gram neg rods (sus pending), urine cx was contaminated (can redraw if indicated), blood cx was NGx2days -PT/OT -Follow-up on urine and blood cultures -Glucose management as below -Post rounds addendum: Antibiotics narrowed to Unasyn with Zosyn discontinued.  New onset-type 2 diabetes Patient currently on 25units lantus with sSSI.  Blood glucose levels 235-291 overnight. TSH decreased 0.108. Blood glucose on presentation of 336. Patient without history of type 2 diabetes.  Currently on no medications for this at home. Patient does have a history of type 2 diabetes in his father.  Urinalysis on presentation was positive for glucose of greater than 500. -Post rounds addendum: We will increase Lantus to 35 units today -Sensitive sliding scale -On discharge will discuss oral medications  Low TSH: low at 0.108 -f/u  outpatient, could be acute phase reactant  Hypokalemia - resolved Potassium 3.7 10/29   -BMP in morning  Chronic back pain Home medications include Percocet 5-325 every 6 hours as needed, Zanaflex 4 mg as needed, gabapentin 600 mg 3 times daily -cont home oxy -Continue home gabapentin -Continue home Zanaflex as needed  Patient reported he is psych patient: Currently on no home medications.    Not on any home medications.  Unable to find further information via chart review.   -Continue to monitor  FEN/GI: N.p.o. pending second surgical debridement today. PPx: Heparin subq  Disposition: Likely home pending clinical work-up.  Subjective:  Patient had repeat debridement surgery yesterday, states he is more sore today and is trying to hold off on requesting his pain medication until his bandage changes as he knows that will be uncomfortable. Denies other complaints at this time.  Objective: Temp:  [97.8 F (36.6 C)-98.7 F (37.1 C)] 98 F (36.7 C) (10/29 0424) Pulse Rate:  [64-90] 64 (10/29 0424) Resp:  [16-23] 19 (10/29 0424) BP: (129-162)/(71-93) 132/77 (10/29 0424) SpO2:  [97 %-100 %] 98 % (10/29 0424) Physical Exam: General: Alert and oriented in no apparent distress Heart: Regular rate and rhythm with no murmurs appreciated Lungs: CTA bilaterally, no wheezing Abdomen: Bowel sounds present, no abdominal pain GU: Penis and scrotum still with swelling though non-painful. Surgical bandage present over mons/inguinal region. Extremities: No lower limb edema  Laboratory: Recent Labs  Lab 06/12/19 0723 06/13/19 0118 06/14/19 0234  WBC 12.2* 13.3* 15.0*  HGB 11.6* 12.1* 11.8*  HCT 35.7* 37.3* 36.3*  PLT 270 298 295   Recent Labs  Lab 06/10/19 0823  06/12/19 0723 06/13/19 0118 06/14/19 0234  NA 135   < > 136 135 134*  K 3.3*   < > 3.1* 3.3* 3.7  CL 100   < > 101 101 103  CO2 16*   < > 24 24 22   BUN 5*   < > 7 8 7   CREATININE 1.10   < > 0.82 0.75 0.66  CALCIUM  9.0   < > 8.1* 7.8* 7.9*  PROT 6.7  --   --   --   --   BILITOT 1.3*  --   --   --   --   ALKPHOS 114  --   --   --   --   ALT 18  --   --   --   --   AST 14*  --   --   --   --   GLUCOSE 353*   < > 222* 254* 258*   < > = values in this interval not displayed.    Mr Thoracic Spine Wo Contrast  Result Date: 05/25/2019 CLINICAL DATA:  Mid back pain EXAM: MRI THORACIC SPINE WITHOUT CONTRAST TECHNIQUE: Multiplanar, multisequence MR imaging of the thoracic spine was performed. No intravenous contrast was administered. COMPARISON:  X-ray 04/30/2019.  Lumbar spine MRI 04/27/2019 FINDINGS: Technical note: Motion degraded examination, particularly affecting the axial sequences. Alignment:  Physiologic. Vertebrae: No fracture, evidence of discitis, or bone lesion. Cord: No abnormal cord signal is evident within the limitations of this exam. Paraspinal and other soft tissues: Negative. Disc levels: T1-T2: Sagittal sequences only. At least mild bilateral foraminal stenosis. No canal stenosis evident. T2-T3: Disc osteophyte complex resulting in moderate left and mild right foraminal stenosis with mild canal stenosis. T3-T4: Disc osteophyte complex results in mild canal stenosis without foraminal stenosis. T4-T5: Disc osteophyte complex results in mild canal stenosis without foraminal stenosis. T5-T6: Disc osteophyte complex results in mild canal stenosis without foraminal stenosis. T6-T7: Prominent disc osteophyte complex resulting in moderate canal stenosis without significant foraminal stenosis. T7-T8: Disc osteophyte complex results in mild canal stenosis without foraminal stenosis. T8-T9: Left paracentral disc osteophyte complex results in mild left foraminal stenosis and mild canal stenosis. T9-T10: Disc osteophyte complex resulting in mild right greater than left foraminal stenosis with minimal canal stenosis. T10-T11: Disc osteophyte complex resulting in mild bilateral foraminal stenosis without canal  stenosis. T11-T12: Diffuse disc osteophyte complex and mild bilateral facet arthrosis results in severe right and moderate left foraminal stenosis without canal stenosis. IMPRESSION: 1. Extensive thoracic spondylosis with multiple levels of canal stenosis, most pronounced at T6-7 where a prominent disc osteophyte complex results in moderate canal stenosis. 2. Multiple levels of bilateral foraminal stenosis, most pronounced at T11-12 where there is severe right and moderate left foraminal stenosis. Electronically Signed   By: Duanne GuessNicholas  Plundo M.D.   On: 05/25/2019 11:23   Ct Abdomen Pelvis W Contrast  Result Date: 06/10/2019 CLINICAL DATA:  Clinical concern for groin infection and Fournier gangrene. EXAM: CT ABDOMEN AND PELVIS WITH CONTRAST TECHNIQUE: Multidetector CT imaging of the abdomen and pelvis was performed using the standard protocol following bolus administration of intravenous contrast. CONTRAST:  100mL OMNIPAQUE IOHEXOL 300 MG/ML  SOLN COMPARISON:  None. FINDINGS: Lower chest: There is mild right basilar atelectasis. Hepatobiliary: No focal liver abnormality is seen. No gallstones, gallbladder wall thickening, or biliary dilatation. Pancreas: Unremarkable. No pancreatic ductal dilatation or surrounding inflammatory changes. Spleen: Normal in size without focal abnormality. Adrenals/Urinary Tract: A left adrenal mass measures 2.1 cm and measures less than 10 Hounsfield  units, consistent with a benign adrenal adenoma or myelolipoma. Kidneys are normal, without renal calculi, focal lesion, or hydronephrosis. Bladder is unremarkable. Stomach/Bowel: Stomach is within normal limits. Appendix appears normal. No evidence of bowel wall thickening, distention, or inflammatory changes. Vascular/Lymphatic: No significant vascular findings are present. No enlarged abdominal lymph nodes. Reproductive: Prostate is unremarkable. Other: There is soft tissue edema and disorganized fluid with subcutaneous emphysema and  overlying skin thickening involving the groin, concerning for Fournier's gangrene. The penis and scrotum are incompletely imaged on this exam. Multiple enlarged bilateral inguinal and iliac chain lymph nodes are likely reactive. There is no involvement of the perianal region or retroperitoneal/intraperitoneal extension. There is no well-defined rim enhancing fluid collection to suggest abscess. No abdominal wall hernia. No abdominopelvic ascites. Musculoskeletal: No focal osseous demineralization to suggest osteomyelitis. Degenerative changes are seen in both hips and the lumbar spine. IMPRESSION: Soft tissue edema with subcutaneous emphysema and overlying skin thickening involving the groin is consistent with Fournier gangrene. Of note, the penis and scrotum are incompletely imaged. No well-defined fluid collection to suggest abscess. No involvement of the perianal region. No evidence of retroperitoneal or intraperitoneal extension. Bilateral inguinal and iliac chain lymph nodes are likely reactive. Electronically Signed   By: Zerita Boers M.D.   On: 06/10/2019 10:56     Lurline Del, DO 06/14/2019, 8:26 AM PGY-1, [place] Intern pager: [phone], text pages welcome

## 2019-06-14 NOTE — Progress Notes (Signed)
Nutrition Follow-up  RD working remotely.  DOCUMENTATION CODES:   Morbid obesity  INTERVENTION:   -Continue Ensure Max po BID, each supplement provides 150 kcal and 30 grams of protein -Continue MVI with minerals daily -Provided "Carbohydrate Counting for People with Diabetes" and "Nutrition Labels: Carbohydrates" handouts from [facility] Nutrition Care Manual. Attached to AVS/ discharge summary -Referred pt to [facility]'s Nutrition and Diabetes Education Services for outpatient diabetes education to further reinforce concepts  NUTRITION DIAGNOSIS:   Increased nutrient needs related to wound healing, post-op healing as evidenced by estimated needs.  Ongoing  GOAL:   Patient will meet greater than or equal to 90% of their needs  Progressing   MONITOR:   PO intake, Supplement acceptance, Diet advancement, Labs, Weight trends, Skin, I & O's  REASON FOR ASSESSMENT:   Consult Diet education  ASSESSMENT:   Mario Little is a 43 y.o. male presenting with groin pain and penile swelling. PMH is significant for chronic back pain, bipolar 1, hypertension.  10/25- s/p Procedure: Incision and drainage of 10 cm suprapubic abscess; s/p cystoscopy 10/28- s/p PROCEDURE: Incision and drainage of additional suprapubic abscesses x2 with: Sharp debridement ofright side of suprapubicwound (7 x 4 x 4 cm) and Sharp debridement ofleft side of suprapubicwound (7 x 3 x 4 cm)   Reviewed I/O's: +660 ml x 24 hours and +5.1 L since admission  UOP: 2.3 L x 24 hours  Attempted to speak with pt via phone, however, no answer. Multiple attempts have been made to discuss new DM diagnosis with pt, however, all attempts have been unsuccessful. Educations handouts have been attached to AVS/ discharge summary and pt has been referred to outpatient diabetes education through [facility]'s Nutrition and Diabetes Education Services for further reinforcement.   Pt remains with good appetite. Noted meal  completion 100%. Pt is accepting of Ensure Max supplements per MAR.   Per MD notes, plan to d/c home with home health once medically stable. Pt remains with wet to dry dressing changes.   Labs reviewed: Na: 134, CBGS: 235-265 (inpatient orders for glycemic control are 0-15 units insulin aspart TID with meals, 0-5 units insulin aspart q HS, 25 units insulin glargine daily).   Diet Order:   Diet Order            Diet Carb Modified Fluid consistency: Thin; Room service appropriate? Yes  Diet effective now              EDUCATION NEEDS:   No education needs have been identified at this time  Skin:  Skin Assessment: Skin Integrity Issues: Skin Integrity Issues:: Incisions Incisions: closed penile incision, closed abdomen  Last BM:  06/13/19  Height:   Ht Readings from Last 1 Encounters:  06/10/19 6\' 1"  (1.854 m)    Weight:   Wt Readings from Last 1 Encounters:  06/10/19 (!) 147.4 kg    Ideal Body Weight:  83.6 kg  BMI:  Body mass index is 42.88 kg/m.  Estimated Nutritional Needs:   Kcal:  2100-2300  Protein:  125-140 grams  Fluid:  > 2.1 L    Barney Gertsch A. Jimmye Norman, RD, LDN, [place] Registered Dietitian II Certified Diabetes Care and Education Specialist Pager: [phone] After hours Pager: [phone]

## 2019-06-15 DIAGNOSIS — L02214 Cutaneous abscess of groin: Secondary | ICD-10-CM | POA: Diagnosis not present

## 2019-06-15 DIAGNOSIS — E1165 Type 2 diabetes mellitus with hyperglycemia: Secondary | ICD-10-CM | POA: Diagnosis not present

## 2019-06-15 DIAGNOSIS — R739 Hyperglycemia, unspecified: Secondary | ICD-10-CM | POA: Diagnosis not present

## 2019-06-15 LAB — BASIC METABOLIC PANEL
Anion gap: 11 (ref 5–15)
BUN: 7 mg/dL (ref 6–20)
CO2: 25 mmol/L (ref 22–32)
Calcium: 8 mg/dL — ABNORMAL LOW (ref 8.9–10.3)
Chloride: 104 mmol/L (ref 98–111)
Creatinine, Ser: 0.63 mg/dL (ref 0.61–1.24)
GFR calc Af Amer: >60 mL/min (ref >60)
GFR calc non Af Amer: >60 mL/min (ref >60)
Glucose, Bld: 182 mg/dL — ABNORMAL HIGH (ref 70–99)
Potassium: 3.3 mmol/L — ABNORMAL LOW (ref 3.5–5.1)
Sodium: 140 mmol/L (ref 135–145)

## 2019-06-15 LAB — CBC
HCT: 39.7 % (ref 39.0–52.0)
Hemoglobin: 12.3 g/dL — ABNORMAL LOW (ref 13.0–17.0)
MCH: 25.6 pg — ABNORMAL LOW (ref 26.0–34.0)
MCHC: 31 g/dL (ref 30.0–36.0)
MCV: 82.5 fL (ref 80.0–100.0)
Platelets: 332 10*3/uL (ref 150–400)
RBC: 4.81 MIL/uL (ref 4.22–5.81)
RDW: 14.5 % (ref 11.5–15.5)
WBC: 12 10*3/uL — ABNORMAL HIGH (ref 4.0–10.5)
nRBC: 0 % (ref 0.0–0.2)

## 2019-06-15 LAB — GLUCOSE, CAPILLARY
Glucose-Capillary: 145 mg/dL — ABNORMAL HIGH (ref 70–99)
Glucose-Capillary: 124 mg/dL — ABNORMAL HIGH (ref 70–99)
Glucose-Capillary: 166 mg/dL — ABNORMAL HIGH (ref 70–99)
Glucose-Capillary: 191 mg/dL — ABNORMAL HIGH (ref 70–99)
Glucose-Capillary: 158 mg/dL — ABNORMAL HIGH (ref 70–99)

## 2019-06-15 LAB — CULTURE, BLOOD (ROUTINE X 2)
Culture: NO GROWTH
Culture: NO GROWTH
Special Requests: ADEQUATE

## 2019-06-15 MED ORDER — TRAMADOL HCL 50 MG PO TABS
50.0000 mg | ORAL_TABLET | Freq: Four times a day (QID) | ORAL | Status: DC
  Administered 2019-06-15 – 2019-06-17 (×10): 50 mg via ORAL
  Filled 2019-06-15 (×12): qty 1

## 2019-06-15 MED ORDER — INSULIN GLARGINE 100 UNIT/ML ~~LOC~~ SOLN
35.0000 [IU] | Freq: Every day | SUBCUTANEOUS | Status: DC
  Administered 2019-06-15 – 2019-06-18 (×4): 35 [IU] via SUBCUTANEOUS
  Filled 2019-06-15 (×4): qty 0.35

## 2019-06-15 MED ORDER — OXYCODONE HCL 5 MG PO TABS
10.0000 mg | ORAL_TABLET | ORAL | Status: DC | PRN
  Administered 2019-06-16 – 2019-06-18 (×6): 10 mg via ORAL
  Filled 2019-06-15 (×6): qty 2

## 2019-06-15 MED ORDER — IBUPROFEN 600 MG PO TABS
600.0000 mg | ORAL_TABLET | Freq: Three times a day (TID) | ORAL | Status: DC
  Administered 2019-06-15 – 2019-06-18 (×10): 600 mg via ORAL
  Filled 2019-06-15 (×10): qty 1

## 2019-06-15 MED ORDER — INSULIN GLARGINE 100 UNIT/ML ~~LOC~~ SOLN: 35.0000 [IU] | Freq: Every day | SUBCUTANEOUS | Status: DC

## 2019-06-15 MED ORDER — POTASSIUM CHLORIDE CRYS ER 20 MEQ PO TBCR
40.0000 meq | EXTENDED_RELEASE_TABLET | Freq: Once | ORAL | Status: AC
  Administered 2019-06-15: 40 meq via ORAL
  Filled 2019-06-15: qty 2

## 2019-06-15 MED ORDER — HYDROMORPHONE HCL 1 MG/ML IJ SOLN
0.5000 mg | INTRAMUSCULAR | Status: DC | PRN
  Administered 2019-06-15 – 2019-06-18 (×6): 1 mg via INTRAVENOUS
  Filled 2019-06-15 (×6): qty 1

## 2019-06-15 NOTE — Evaluation (Signed)
Occupational Therapy Evaluation Patient Details Name: Mario Little MRN: [id number] DOB: 14-Mar-1976 Today's Date: 06/15/2019    History of Present Illness 43yo male with groin pain and penile edema, CT consistent with Fournier's gangrene. Received I&D of mons on 10/25 and 10/29. PMH PTDS, multiple personality disorder, intermittent explosive disorder, HTN, DM, bipolar, chonic back pain   Clinical Impression   This 43 y/o male presents with the above. PTA pt reports independence with ADL and functional mobility. Pt with limitations mostly due to significant pain with mobility/functional tasks. Pt performing bed mobility and functional transfers using RW with minA, able to perform room level mobility a short distance with close minguard assist. Pt requiring significant time and effort for mobility due to pain. Also have noted increased edema in scrotral region, may benefit from scrotal sling, will continue to assess/address. Pt will benefit from continued acute OT services, currently recommend SNF level therapies however pending progress and availability of assist at home pt may be able to progress to home with [facility]. Will continue to follow.     Follow Up Recommendations  SNF;Supervision/Assistance - 24 hour(SNF vs HH with 24hr, pending progress)    Equipment Recommendations  Toilet riser           Precautions / Restrictions Precautions Precautions: Fall;Other (comment) Precaution Comments: multiple mental illnesses currently not on medication, chronic severe hip and LBP; large wound in groin region Restrictions Weight Bearing Restrictions: No      Mobility Bed Mobility Overal bed mobility: Needs Assistance Bed Mobility: Supine to Sit;Sit to Supine     Supine to sit: Min guard Sit to supine: Min assist   General bed mobility comments: close minguard assist for supine>sit. pt has specific way of getting into/out of bed due to increased pain levels with sitting upright/trunk  flexion. pt with bed elevated and able to progress LEs over EOB and elevate trunk given significant time/effort; light minA for RLE onto bed when returning to supine  Transfers Overall transfer level: Needs assistance Equipment used: Rolling walker (2 wheeled) Transfers: Sit to/from Stand Sit to Stand: Min assist         General transfer comment: MinA to stabilize RW, no physical assist given    Balance Overall balance assessment: Needs assistance Sitting-balance support: Feet supported Sitting balance-Leahy Scale: Fair     Standing balance support: Bilateral upper extremity supported Standing balance-Leahy Scale: Poor Standing balance comment: heavy reliance on UE support due to pain                           ADL either performed or assessed with clinical judgement   ADL Overall ADL's : Needs assistance/impaired Eating/Feeding: Modified independent;Sitting   Grooming: Set up;Sitting Grooming Details (indicate cue type and reason): pt with too much pain while standing to tolerate performing ADL in standing today Upper Body Bathing: Min guard;Sitting   Lower Body Bathing: Maximal assistance;Sit to/from stand   Upper Body Dressing : Set up;Min guard;Sitting   Lower Body Dressing: Maximal assistance;Sit to/from stand Lower Body Dressing Details (indicate cue type and reason): likely will benefit from AE Toilet Transfer: Min guard;Ambulation;RW Toilet Transfer Details (indicate cue type and reason): simulated via transfer to/from EOB Toileting- Clothing Manipulation and Hygiene: Moderate assistance;Sit to/from stand       Functional mobility during ADLs: Min guard;Rolling walker General ADL Comments: pt requires significant time and effort for mobility tasks as pt with significant pain with all mobility  Pertinent Vitals/Pain Pain Assessment: Faces Faces Pain Scale: Hurts whole lot Pain Location: groin, back, hips Pain  Descriptors / Indicators: Aching;Grimacing;Sharp;Moaning     Hand Dominance     Extremity/Trunk Assessment Upper Extremity Assessment Upper Extremity Assessment: Overall WFL for tasks assessed   Lower Extremity Assessment Lower Extremity Assessment: Defer to PT evaluation   Cervical / Trunk Assessment Cervical / Trunk Assessment: Normal   Communication Communication Communication: No difficulties   Cognition Arousal/Alertness: Awake/alert Behavior During Therapy: WFL for tasks assessed/performed Overall Cognitive Status: Within Functional Limits for tasks assessed                                     General Comments       Exercises     Shoulder Instructions      Home Living Family/patient expects to be discharged to:: Private residence Living Arrangements: Parent Available Help at Discharge: Family;Available PRN/intermittently Type of Home: House Home Access: Stairs to enter [employer] of Steps: 4 Entrance Stairs-Rails: Can reach both Home Layout: One level     Bathroom Shower/Tub: Teacher, early years/pre: Standard     Home Equipment: None          Prior Functioning/Environment Level of Independence: Independent                 OT Problem List: Decreased strength;Decreased range of motion;Decreased activity tolerance;Impaired balance (sitting and/or standing);Decreased safety awareness;Decreased knowledge of use of DME or AE;Decreased knowledge of precautions;Obesity;Pain      OT Treatment/Interventions: Self-care/ADL training;Therapeutic exercise;Energy conservation;DME and/or AE instruction;Therapeutic activities;Patient/family education;Balance training    OT Goals(Current goals can be found in the care plan section) Acute Rehab OT Goals Patient Stated Goal: less pain, wound to heal OT Goal Formulation: With patient Time For Goal Achievement: 06/29/19 Potential to Achieve Goals: Good  OT Frequency: Min  2X/week   Barriers to D/C:            Co-evaluation              AM-PAC OT "6 Clicks" Daily Activity     Outcome Measure Help from another person eating meals?: None Help from another person taking care of personal grooming?: A Little Help from another person toileting, which includes using toliet, bedpan, or urinal?: A Lot Help from another person bathing (including washing, rinsing, drying)?: A Lot Help from another person to put on and taking off regular upper body clothing?: None Help from another person to put on and taking off regular lower body clothing?: A Lot 6 Click Score: 17   End of Session Equipment Utilized During Treatment: Rolling walker Nurse Communication: Mobility status  Activity Tolerance: Patient tolerated treatment well;Patient limited by pain Patient left: in bed;with call bell/phone within reach  OT Visit Diagnosis: Other abnormalities of gait and mobility (R26.89);Pain Pain - part of body: (groin, back)                Time: 3903-0092 OT Time Calculation (min): 29 min Charges:  OT General Charges $OT Visit: 1 Visit OT Evaluation $OT Eval Moderate Complexity: 1 Mod OT Treatments $Self Care/Home Management : 8-22 mins  Lou Cal, OT Supplemental Rehabilitation Services Pager [phone] Office [place] 06/15/2019, 4:36 PM

## 2019-06-15 NOTE — Progress Notes (Signed)
Patient ID: Mario Little, male   DOB: [birth year], 43 y.o.   MRN: [id number]    2 Days Post-Op  Subjective: Patient continues to have pain control issues, specifically with dressing changes  ROS: See above, otherwise other systems negative  Objective: Vital signs in last 24 hours: Temp:  [98.1 F (36.7 C)-98.4 F (36.9 C)] 98.2 F (36.8 C) (10/30 0511) Pulse Rate:  [63-76] 74 (10/30 0511) Resp:  [16-18] 16 (10/30 0511) BP: (129-150)/(67-94) 149/84 (10/30 0511) SpO2:  [99 %-100 %] 100 % (10/30 0511) Last BM Date: 06/14/19  Intake/Output from previous day: 10/29 0701 - 10/30 0700 In: 440 [P.O.:240; IV Piggyback:200] Out: 1750 [Urine:1750] Intake/Output this shift: No intake/output data recorded.  PE: Skin: wound is clean, has some slight increase in drainage, but overall appears to be improving.  Small nodule noted in scrotum.  nontender and non infectious appearing.  Lab Results:  Recent Labs    06/14/19 0234 06/15/19 0211  WBC 15.0* 12.0*  HGB 11.8* 12.3*  HCT 36.3* 39.7  PLT 295 332   BMET Recent Labs    06/14/19 0234 06/15/19 0211  NA 134* 140  K 3.7 3.3*  CL 103 104  CO2 22 25  GLUCOSE 258* 182*  BUN 7 7  CREATININE 0.66 0.63  CALCIUM 7.9* 8.0*   PT/INR No results for input(s): LABPROT, INR in the last 72 hours. CMP     Component Value Date/Time   NA 140 06/15/2019 0211   K 3.3 (L) 06/15/2019 0211   CL 104 06/15/2019 0211   CO2 25 06/15/2019 0211   GLUCOSE 182 (H) 06/15/2019 0211   BUN 7 06/15/2019 0211   CREATININE 0.63 06/15/2019 0211   CALCIUM 8.0 (L) 06/15/2019 0211   PROT 6.7 06/10/2019 0823   ALBUMIN 2.6 (L) 06/10/2019 0823   AST 14 (L) 06/10/2019 0823   ALT 18 06/10/2019 0823   ALKPHOS 114 06/10/2019 0823   BILITOT 1.3 (H) 06/10/2019 0823   GFRNONAA >60 06/15/2019 0211   GFRAA >60 06/15/2019 0211   Lipase  No results found for: LIPASE     Studies/Results: No results found.  Anti-infectives: Anti-infectives (From  admission, onward)   Start     Dose/Rate Route Frequency Ordered Stop   06/14/19 1600  Ampicillin-Sulbactam (UNASYN) 3 g in sodium chloride 0.9 % 100 mL IVPB     3 g 200 mL/hr over 30 Minutes Intravenous Every 6 hours 06/14/19 1427     06/10/19 2200  vancomycin (VANCOCIN) 1,500 mg in sodium chloride 0.9 % 500 mL IVPB  Status:  Discontinued     1,500 mg 250 mL/hr over 120 Minutes Intravenous Every 12 hours 06/10/19 0913 06/13/19 1117   06/10/19 1800  piperacillin-tazobactam (ZOSYN) IVPB 3.375 g  Status:  Discontinued     3.375 g 12.5 mL/hr over 240 Minutes Intravenous Every 8 hours 06/10/19 1128 06/14/19 1154   06/10/19 1130  piperacillin-tazobactam (ZOSYN) IVPB 3.375 g     3.375 g 100 mL/hr over 30 Minutes Intravenous  Once 06/10/19 1128 06/10/19 1154   06/10/19 0845  vancomycin (VANCOCIN) IVPB 1000 mg/200 mL premix  Status:  Discontinued     1,000 mg 200 mL/hr over 60 Minutes Intravenous  Once 06/10/19 0833 06/10/19 0836   06/10/19 0845  cefTRIAXone (ROCEPHIN) 2 g in sodium chloride 0.9 % 100 mL IVPB     2 g 200 mL/hr over 30 Minutes Intravenous  Once 06/10/19 0833 06/10/19 0926   06/10/19 0845  clindamycin (CLEOCIN) IVPB 900  mg     900 mg 100 mL/hr over 30 Minutes Intravenous  Once 06/10/19 0834 06/10/19 0926   06/10/19 0845  vancomycin (VANCOCIN) 2,000 mg in sodium chloride 0.9 % 500 mL IVPB     2,000 mg 250 mL/hr over 120 Minutes Intravenous  Once 06/10/19 0836 06/10/19 1153       Assessment/Plan DM Chronic back pain  POD 5,2, s/pIncision and drainage of 10 cm suprapubic abscess10/25 Dr. [name] cystoscopy per Dr. Tresa Moore, repeat I&D Dr. Dema Severin 10/28 - culturesshows group B strep, moderate prevotella bivia -wound continues to improve, WBC down to 12K -cont abx therapy -BID dressing changes -pain control with dressing changes is going to be a struggle for this patient.  I have adjusted his pain medications some, schedule tylenol, ibuprofen, gabapentin, and tramadol.  Prn  oxy 10 and prn dilaudid now down to 0.5-1mg  q 3hrs.  Will continue to try and work on IV wean  ID -zosyn10/25>>10/29, unasyn 10/30 FEN -carb mod diet VTE -SCDs, sq heparin   LOS: 5 days    Henreitta Cea , [facility] Surgery 06/15/2019, 9:06 AM Please see Amion for pager number during day hours 7:00am-4:30pm

## 2019-06-15 NOTE — Anesthesia Postprocedure Evaluation (Signed)
Anesthesia Post Note  Patient: Mario Little  Procedure(s) Performed: IRRIGATION AND DEBRIDEMENT MONS ABSCESS (N/A Abdomen)     Patient location during evaluation: PACU Anesthesia Type: General Level of consciousness: awake and alert Pain management: pain level controlled Vital Signs Assessment: post-procedure vital signs reviewed and stable Respiratory status: spontaneous breathing, nonlabored ventilation, respiratory function stable and patient connected to nasal cannula oxygen Cardiovascular status: blood pressure returned to baseline and stable Postop Assessment: no apparent nausea or vomiting Anesthetic complications: no    Last Vitals:  Vitals:   06/14/19 1730 06/14/19 2124  BP: 129/67 131/78  Pulse: 63 67  Resp:  18  Temp: 36.7 C 36.9 C  SpO2: 99% 100%    Last Pain:  Vitals:   06/14/19 2124  TempSrc: Oral  PainSc: 6                  Cleatus Gabriel

## 2019-06-15 NOTE — Progress Notes (Signed)
Family Medicine Teaching Service Daily Progress Note Intern Pager: [phone]  Patient name: Mario Little Medical record number: [id number] Date of birth: 06/23/1976 Age: 43 y.o. Gender: male  Primary Care Provider: Patient, No Pcp Per Consultants: General surgery, urology Code Status: Full  Pt Overview and Major Events to Date:  10/25-patient admitted 10/25-I&D and cystoscopy  Assessment and Plan: Mario Little is a 43 y.o. male presenting with groin pain and penile swelling. PMH is significant for chronic back pain, bipolar 1, hypertension.  Groin Abscess - vitals stable Status post 2nd surgical I&D.  WBC 13.3 no 10/28, now 12.0 on 10/30.  CT scan showed soft tissue edema with subcutaneous emphysema and overlying skin thickening involving the greater groin which was consistent with Fournier gangrene.  -General surgery and urology on board, appreciate recommendation -PRN oxy for pain and MiraLAX daily -Antibiotics narrowed to Unasyn.  Status post Zosyn. -Cultures pending. : wound cx showed mod strep B and mod gram neg rods (sus pending), urine cx was contaminated (can redraw if indicated), blood cx was NGx2days -PT/OT -Follow-up on urine and blood cultures  New onset-type 2 diabetes Patient currently on 35 units Lantus, used 12 units short acting over the last 24 hours.  Blood glucose levels ranging in the upper 190s to mid 200s. TSH decreased 0.108. Blood glucose on presentation of 336. Patient without history of type 2 diabetes.  Currently on no medications for this at home. -Continue 35 units Lantus -Sensitive sliding scale -On discharge will discuss oral medications  Low TSH: low at 0.108 -f/u outpatient, could be acute phase reactant  Hypokalemia  Potassium 3.3 on 10/30 -Replaced with 40 K-Dur -BMP in morning  Chronic back pain Home medications include Percocet 5-325 every 6 hours as needed, Zanaflex 4 mg as needed, gabapentin 600 mg 3 times daily -cont home  oxy -Continue home gabapentin -Continue home Zanaflex as needed  Patient reported he is psych patient: Currently on no home medications.    Not on any home medications.  Unable to find further information via chart review.   -Continue to monitor  FEN/GI: Diabetic diet PPx: Heparin subq  Disposition: Likely home pending clinical work-up.  Subjective:  Patient states his pain is still present but is somewhat better than yesterday.  He states he is trying to not use pain medication anymore than absolutely needed as he has not been on pills.  Patient does endorse a nontender nodule adjacent to his right scrotum that he had not noticed before.  On palpation this is nontender and is approximately 3cm in diameter.  Objective: Temp:  [98.1 F (36.7 C)-98.4 F (36.9 C)] 98.2 F (36.8 C) (10/30 0511) Pulse Rate:  [63-96] 74 (10/30 0511) Resp:  [16-18] 16 (10/30 0511) BP: (129-150)/(67-94) 149/84 (10/30 0511) SpO2:  [98 %-100 %] 100 % (10/30 0511)  Physical Exam: General: Alert and oriented in no apparent distress Heart: Regular rate and rhythm with no murmurs appreciated Lungs: CTA bilaterally, no wheezing Abdomen: Bowel sounds present, no abdominal pain GU: Surgical bandage covering incision over inguinal region, patient scrotum and penis still with swelling.  3 cm nonpainful solid lesion palpated on right scrotum.    Laboratory: Recent Labs  Lab 06/13/19 0118 06/14/19 0234 06/15/19 0211  WBC 13.3* 15.0* 12.0*  HGB 12.1* 11.8* 12.3*  HCT 37.3* 36.3* 39.7  PLT 298 295 332   Recent Labs  Lab 06/10/19 0823  06/13/19 0118 06/14/19 0234 06/15/19 0211  NA 135   < > 135 134*  140  K 3.3*   < > 3.3* 3.7 3.3*  CL 100   < > 101 103 104  CO2 16*   < > 24 22 25   BUN 5*   < > 8 7 7   CREATININE 1.10   < > 0.75 0.66 0.63  CALCIUM 9.0   < > 7.8* 7.9* 8.0*  PROT 6.7  --   --   --   --   BILITOT 1.3*  --   --   --   --   ALKPHOS 114  --   --   --   --   ALT 18  --   --   --   --    AST 14*  --   --   --   --   GLUCOSE 353*   < > 254* 258* 182*   < > = values in this interval not displayed.    Mr Thoracic Spine Wo Contrast  Result Date: 05/25/2019 CLINICAL DATA:  Mid back pain EXAM: MRI THORACIC SPINE WITHOUT CONTRAST TECHNIQUE: Multiplanar, multisequence MR imaging of the thoracic spine was performed. No intravenous contrast was administered. COMPARISON:  X-ray 04/30/2019.  Lumbar spine MRI 04/27/2019 FINDINGS: Technical note: Motion degraded examination, particularly affecting the axial sequences. Alignment:  Physiologic. Vertebrae: No fracture, evidence of discitis, or bone lesion. Cord: No abnormal cord signal is evident within the limitations of this exam. Paraspinal and other soft tissues: Negative. Disc levels: T1-T2: Sagittal sequences only. At least mild bilateral foraminal stenosis. No canal stenosis evident. T2-T3: Disc osteophyte complex resulting in moderate left and mild right foraminal stenosis with mild canal stenosis. T3-T4: Disc osteophyte complex results in mild canal stenosis without foraminal stenosis. T4-T5: Disc osteophyte complex results in mild canal stenosis without foraminal stenosis. T5-T6: Disc osteophyte complex results in mild canal stenosis without foraminal stenosis. T6-T7: Prominent disc osteophyte complex resulting in moderate canal stenosis without significant foraminal stenosis. T7-T8: Disc osteophyte complex results in mild canal stenosis without foraminal stenosis. T8-T9: Left paracentral disc osteophyte complex results in mild left foraminal stenosis and mild canal stenosis. T9-T10: Disc osteophyte complex resulting in mild right greater than left foraminal stenosis with minimal canal stenosis. T10-T11: Disc osteophyte complex resulting in mild bilateral foraminal stenosis without canal stenosis. T11-T12: Diffuse disc osteophyte complex and mild bilateral facet arthrosis results in severe right and moderate left foraminal stenosis without canal  stenosis. IMPRESSION: 1. Extensive thoracic spondylosis with multiple levels of canal stenosis, most pronounced at T6-7 where a prominent disc osteophyte complex results in moderate canal stenosis. 2. Multiple levels of bilateral foraminal stenosis, most pronounced at T11-12 where there is severe right and moderate left foraminal stenosis. Electronically Signed   By: Duanne GuessNicholas  Plundo M.D.   On: 05/25/2019 11:23   Ct Abdomen Pelvis W Contrast  Result Date: 06/10/2019 CLINICAL DATA:  Clinical concern for groin infection and Fournier gangrene. EXAM: CT ABDOMEN AND PELVIS WITH CONTRAST TECHNIQUE: Multidetector CT imaging of the abdomen and pelvis was performed using the standard protocol following bolus administration of intravenous contrast. CONTRAST:  100mL OMNIPAQUE IOHEXOL 300 MG/ML  SOLN COMPARISON:  None. FINDINGS: Lower chest: There is mild right basilar atelectasis. Hepatobiliary: No focal liver abnormality is seen. No gallstones, gallbladder wall thickening, or biliary dilatation. Pancreas: Unremarkable. No pancreatic ductal dilatation or surrounding inflammatory changes. Spleen: Normal in size without focal abnormality. Adrenals/Urinary Tract: A left adrenal mass measures 2.1 cm and measures less than 10 Hounsfield units, consistent with a benign adrenal adenoma or  myelolipoma. Kidneys are normal, without renal calculi, focal lesion, or hydronephrosis. Bladder is unremarkable. Stomach/Bowel: Stomach is within normal limits. Appendix appears normal. No evidence of bowel wall thickening, distention, or inflammatory changes. Vascular/Lymphatic: No significant vascular findings are present. No enlarged abdominal lymph nodes. Reproductive: Prostate is unremarkable. Other: There is soft tissue edema and disorganized fluid with subcutaneous emphysema and overlying skin thickening involving the groin, concerning for Fournier's gangrene. The penis and scrotum are incompletely imaged on this exam. Multiple enlarged  bilateral inguinal and iliac chain lymph nodes are likely reactive. There is no involvement of the perianal region or retroperitoneal/intraperitoneal extension. There is no well-defined rim enhancing fluid collection to suggest abscess. No abdominal wall hernia. No abdominopelvic ascites. Musculoskeletal: No focal osseous demineralization to suggest osteomyelitis. Degenerative changes are seen in both hips and the lumbar spine. IMPRESSION: Soft tissue edema with subcutaneous emphysema and overlying skin thickening involving the groin is consistent with Fournier gangrene. Of note, the penis and scrotum are incompletely imaged. No well-defined fluid collection to suggest abscess. No involvement of the perianal region. No evidence of retroperitoneal or intraperitoneal extension. Bilateral inguinal and iliac chain lymph nodes are likely reactive. Electronically Signed   By: Zerita Boers M.D.   On: 06/10/2019 10:56     Lurline Del, DO 06/15/2019, 6:35 AM PGY-1, [place] Intern pager: [phone], text pages welcome

## 2019-06-16 DIAGNOSIS — E1165 Type 2 diabetes mellitus with hyperglycemia: Secondary | ICD-10-CM | POA: Diagnosis not present

## 2019-06-16 DIAGNOSIS — R739 Hyperglycemia, unspecified: Secondary | ICD-10-CM | POA: Diagnosis present

## 2019-06-16 DIAGNOSIS — L02214 Cutaneous abscess of groin: Secondary | ICD-10-CM | POA: Diagnosis not present

## 2019-06-16 LAB — CBC
HCT: 37.2 % — ABNORMAL LOW (ref 39.0–52.0)
Hemoglobin: 11.6 g/dL — ABNORMAL LOW (ref 13.0–17.0)
MCH: 25.7 pg — ABNORMAL LOW (ref 26.0–34.0)
MCHC: 31.2 g/dL (ref 30.0–36.0)
MCV: 82.3 fL (ref 80.0–100.0)
Platelets: 315 10*3/uL (ref 150–400)
RBC: 4.52 MIL/uL (ref 4.22–5.81)
RDW: 14.6 % (ref 11.5–15.5)
WBC: 12.4 10*3/uL — ABNORMAL HIGH (ref 4.0–10.5)
nRBC: 0 % (ref 0.0–0.2)

## 2019-06-16 LAB — BASIC METABOLIC PANEL
Anion gap: 10 (ref 5–15)
BUN: 7 mg/dL (ref 6–20)
CO2: 22 mmol/L (ref 22–32)
Calcium: 8 mg/dL — ABNORMAL LOW (ref 8.9–10.3)
Chloride: 106 mmol/L (ref 98–111)
Creatinine, Ser: 0.67 mg/dL (ref 0.61–1.24)
GFR calc Af Amer: >60 mL/min (ref >60)
GFR calc non Af Amer: >60 mL/min (ref >60)
Glucose, Bld: 122 mg/dL — ABNORMAL HIGH (ref 70–99)
Potassium: 3.3 mmol/L — ABNORMAL LOW (ref 3.5–5.1)
Sodium: 138 mmol/L (ref 135–145)

## 2019-06-16 LAB — GLUCOSE, CAPILLARY
Glucose-Capillary: 89 mg/dL (ref 70–99)
Glucose-Capillary: 199 mg/dL — ABNORMAL HIGH (ref 70–99)
Glucose-Capillary: 121 mg/dL — ABNORMAL HIGH (ref 70–99)
Glucose-Capillary: 127 mg/dL — ABNORMAL HIGH (ref 70–99)

## 2019-06-16 MED ORDER — WHITE PETROLATUM EX OINT
TOPICAL_OINTMENT | CUTANEOUS | Status: AC
  Administered 2019-06-16: 04:00:00
  Filled 2019-06-16: qty 28.35

## 2019-06-16 MED ORDER — POTASSIUM CHLORIDE CRYS ER 20 MEQ PO TBCR
40.0000 meq | EXTENDED_RELEASE_TABLET | Freq: Once | ORAL | Status: AC
  Administered 2019-06-16: 40 meq via ORAL
  Filled 2019-06-16: qty 2

## 2019-06-16 NOTE — Progress Notes (Signed)
Family Medicine Teaching Service Daily Progress Note Intern Pager: [phone]  Patient name: Mario Little Medical record number: [id number] Date of birth: [birth year] Age: 43 y.o. Gender: male  Primary Care Provider: Patient, No Pcp Per Consultants: General surgery, urology Code Status: Full  Pt Overview and Major Events to Date:  10/25-patient admitted 10/25-I&D and cystoscopy 10/28-second surgical I&D  Antibiotic course: IV vancomycin: 10/24-10/28 IV Zosyn: 10/24-10/29 IV Ancef: 10/29-present   Assessment and Plan: Mario Little is a 43 y.o. male presenting with groin pain and penile swelling. PMH is significant for chronic back pain, bipolar 1, hypertension.  Groin Abscess -  Status post 2 I&D's.  Still has significant erythema and swelling in the genital region.  Patient says that he is in pain this a.m., but this is controlled with his current pain regimen.  Vital signs of been stable and patient is been afebrile.  Per general surgery's recommendation, plan to continue IV antibiotics until improved swelling erythema and region.  Cultures grew moderate group B strep S.  Agalactiae.  Presumed to be penicillin susceptible.  Blood cultures negative x5 days. -General surgery and urology on board, appreciate recommendation -PRN oxy for pain and MiraLAX daily -Antibiotics narrowed to Unasyn.  Status post Zosyn. -PT/OT.  PT recommending home health PT.  OT recommending SNF.   New onset-type 2 diabetes Currently on 35 units Lantus and sliding scale insulin.  CBGs have been well controlled in the 150s to 190s..   -Continue 35 units Lantus -Sensitive sliding scale -On discharge will discuss oral medications  Low TSH: low at 0.108 -f/u outpatient, could be acute phase reactant  Hypokalemia  Mild, potassium 3.3 on 10/31 -Replaced with 40 K-Dur -BMP in morning  Chronic back pain Home medications include Percocet 5-325 every 6 hours as needed, Zanaflex 4 mg as needed,  gabapentin 600 mg 3 times daily -cont home oxy -Continue home gabapentin -Continue home Zanaflex as needed  Patient reported he is psych patient: Currently on no home medications.    Not on any home medications.  Unable to find further information via chart review.    Mood stable. -Continue to monitor  FEN/GI: Diabetic diet PPx: Heparin subq  Disposition: Continue IV antibiotics  Subjective:  Patient versus some groin pain.  Does a that is better, but would like one of his PRN medications.  Denies any chest pain, shortness of breath, domino pain or other issues this morning.  Objective: Temp:  [97.7 F (36.5 C)-98.4 F (36.9 C)] 97.7 F (36.5 C) (10/31 0353) Pulse Rate:  [66-83] 67 (10/31 0353) Resp:  [16-18] 18 (10/31 0353) BP: (126-143)/(75-85) 126/75 (10/31 0353) SpO2:  [95 %-100 %] 100 % (10/31 0353)  Physical Exam: General: Alert and oriented in no apparent distress Heart: Regular rate and rhythm with no murmurs appreciated Lungs: CTA bilaterally, no wheezing Abdomen: Bowel sounds present, no abdominal pain GU: Surgical bandage covering incision over inguinal region, patient scrotum and penis still with swelling.     Laboratory: Recent Labs  Lab 06/14/19 0234 06/15/19 0211 06/16/19 0335  WBC 15.0* 12.0* 12.4*  HGB 11.8* 12.3* 11.6*  HCT 36.3* 39.7 37.2*  PLT 295 332 315   Recent Labs  Lab 06/10/19 0823  06/14/19 0234 06/15/19 0211 06/16/19 0335  NA 135   < > 134* 140 138  K 3.3*   < > 3.7 3.3* 3.3*  CL 100   < > 103 104 106  CO2 16*   < > 22 25 22   BUN  5*   < > 7 7 7   CREATININE 1.10   < > 0.66 0.63 0.67  CALCIUM 9.0   < > 7.9* 8.0* 8.0*  PROT 6.7  --   --   --   --   BILITOT 1.3*  --   --   --   --   ALKPHOS 114  --   --   --   --   ALT 18  --   --   --   --   AST 14*  --   --   --   --   GLUCOSE 353*   < > 258* 182* 122*   < > = values in this interval not displayed.    Mr Thoracic Spine Wo Contrast  Result Date: 05/25/2019 CLINICAL DATA:   Mid back pain EXAM: MRI THORACIC SPINE WITHOUT CONTRAST TECHNIQUE: Multiplanar, multisequence MR imaging of the thoracic spine was performed. No intravenous contrast was administered. COMPARISON:  X-ray 04/30/2019.  Lumbar spine MRI 04/27/2019 FINDINGS: Technical note: Motion degraded examination, particularly affecting the axial sequences. Alignment:  Physiologic. Vertebrae: No fracture, evidence of discitis, or bone lesion. Cord: No abnormal cord signal is evident within the limitations of this exam. Paraspinal and other soft tissues: Negative. Disc levels: T1-T2: Sagittal sequences only. At least mild bilateral foraminal stenosis. No canal stenosis evident. T2-T3: Disc osteophyte complex resulting in moderate left and mild right foraminal stenosis with mild canal stenosis. T3-T4: Disc osteophyte complex results in mild canal stenosis without foraminal stenosis. T4-T5: Disc osteophyte complex results in mild canal stenosis without foraminal stenosis. T5-T6: Disc osteophyte complex results in mild canal stenosis without foraminal stenosis. T6-T7: Prominent disc osteophyte complex resulting in moderate canal stenosis without significant foraminal stenosis. T7-T8: Disc osteophyte complex results in mild canal stenosis without foraminal stenosis. T8-T9: Left paracentral disc osteophyte complex results in mild left foraminal stenosis and mild canal stenosis. T9-T10: Disc osteophyte complex resulting in mild right greater than left foraminal stenosis with minimal canal stenosis. T10-T11: Disc osteophyte complex resulting in mild bilateral foraminal stenosis without canal stenosis. T11-T12: Diffuse disc osteophyte complex and mild bilateral facet arthrosis results in severe right and moderate left foraminal stenosis without canal stenosis. IMPRESSION: 1. Extensive thoracic spondylosis with multiple levels of canal stenosis, most pronounced at T6-7 where a prominent disc osteophyte complex results in moderate canal  stenosis. 2. Multiple levels of bilateral foraminal stenosis, most pronounced at T11-12 where there is severe right and moderate left foraminal stenosis. Electronically Signed   By: Duanne GuessNicholas  Plundo M.D.   On: 05/25/2019 11:23   Ct Abdomen Pelvis W Contrast  Result Date: 06/10/2019 CLINICAL DATA:  Clinical concern for groin infection and Fournier gangrene. EXAM: CT ABDOMEN AND PELVIS WITH CONTRAST TECHNIQUE: Multidetector CT imaging of the abdomen and pelvis was performed using the standard protocol following bolus administration of intravenous contrast. CONTRAST:  100mL OMNIPAQUE IOHEXOL 300 MG/ML  SOLN COMPARISON:  None. FINDINGS: Lower chest: There is mild right basilar atelectasis. Hepatobiliary: No focal liver abnormality is seen. No gallstones, gallbladder wall thickening, or biliary dilatation. Pancreas: Unremarkable. No pancreatic ductal dilatation or surrounding inflammatory changes. Spleen: Normal in size without focal abnormality. Adrenals/Urinary Tract: A left adrenal mass measures 2.1 cm and measures less than 10 Hounsfield units, consistent with a benign adrenal adenoma or myelolipoma. Kidneys are normal, without renal calculi, focal lesion, or hydronephrosis. Bladder is unremarkable. Stomach/Bowel: Stomach is within normal limits. Appendix appears normal. No evidence of bowel wall thickening, distention, or inflammatory changes.  Vascular/Lymphatic: No significant vascular findings are present. No enlarged abdominal lymph nodes. Reproductive: Prostate is unremarkable. Other: There is soft tissue edema and disorganized fluid with subcutaneous emphysema and overlying skin thickening involving the groin, concerning for Fournier's gangrene. The penis and scrotum are incompletely imaged on this exam. Multiple enlarged bilateral inguinal and iliac chain lymph nodes are likely reactive. There is no involvement of the perianal region or retroperitoneal/intraperitoneal extension. There is no well-defined  rim enhancing fluid collection to suggest abscess. No abdominal wall hernia. No abdominopelvic ascites. Musculoskeletal: No focal osseous demineralization to suggest osteomyelitis. Degenerative changes are seen in both hips and the lumbar spine. IMPRESSION: Soft tissue edema with subcutaneous emphysema and overlying skin thickening involving the groin is consistent with Fournier gangrene. Of note, the penis and scrotum are incompletely imaged. No well-defined fluid collection to suggest abscess. No involvement of the perianal region. No evidence of retroperitoneal or intraperitoneal extension. Bilateral inguinal and iliac chain lymph nodes are likely reactive. Electronically Signed   By: Zerita Boers M.D.   On: 06/10/2019 10:56     Bonnita Hollow, MD 06/16/2019, 8:21 AM PGY-3, [place] Intern pager: [phone], text pages welcome

## 2019-06-16 NOTE — Progress Notes (Signed)
[facility] Office:  [phone] General Surgery Progress Note   LOS: 6 days  POD -  3 Days Post-Op  Chief Complaint: Suprapubic abscess  Assessment and Plan: 1.  IRRIGATION AND DEBRIDEMENT MONS ABSCESS - 10/25 - [place],  Cysto - [place] - 10/25, 10/28 - [place]  On zosyn10/25>>10/29, unasyn 10/30  Needs to ambulate more - limited by hip  Can get in shower  Wound is looking cleaner  2.  DM  HgbA1C - >15 - 06/11/2019 3.  Right hip pain/back pain 4.  Obese 5.  Unclear psych history 6.  DVT prophylaxis - SQ heparin   Active Problems:   Fournier's gangrene   Groin abscess   Type 2 diabetes mellitus with hyperglycemia (HCC)  Subjective:  Doing better.  But according to nurses, still needing a lot of pain meds.  I discussed getting in the shower.  Objective:   Vitals:   06/15/19 2154 06/16/19 0353  BP: 136/76 126/75  Pulse: 66 67  Resp: 18 18  Temp: 97.8 F (36.6 C) 97.7 F (36.5 C)  SpO2: 100% 100%     Intake/Output from previous day:  10/30 0701 - 10/31 0700 In: 1326 [P.O.:960; IV Piggyback:366] Out: 800 [Urine:800]  Intake/Output this shift:  No intake/output data recorded.   Physical Exam:   General: Obese AA M who is alert and oriented.    HEENT: Normal. Pupils equal. .   Lungs: Clear   Abdomen: Soft   Wound: Suprapubic wound looks clean.  I did not take the entire dressing down.   Lab Results:    Recent Labs    06/15/19 0211 06/16/19 0335  WBC 12.0* 12.4*  HGB 12.3* 11.6*  HCT 39.7 37.2*  PLT 332 315    BMET   Recent Labs    06/15/19 0211 06/16/19 0335  NA 140 138  K 3.3* 3.3*  CL 104 106  CO2 25 22  GLUCOSE 182* 122*  BUN 7 7  CREATININE 0.63 0.67  CALCIUM 8.0* 8.0*    PT/INR  No results for input(s): LABPROT, INR in the last 72 hours.  ABG  No results for input(s): PHART, HCO3 in the last 72 hours.  Invalid input(s): PCO2, PO2   Studies/Results:  No results found.   Anti-infectives:   Anti-infectives (From  admission, onward)   Start     Dose/Rate Route Frequency Ordered Stop   06/14/19 1600  Ampicillin-Sulbactam (UNASYN) 3 g in sodium chloride 0.9 % 100 mL IVPB     3 g 200 mL/hr over 30 Minutes Intravenous Every 6 hours 06/14/19 1427     06/10/19 2200  vancomycin (VANCOCIN) 1,500 mg in sodium chloride 0.9 % 500 mL IVPB  Status:  Discontinued     1,500 mg 250 mL/hr over 120 Minutes Intravenous Every 12 hours 06/10/19 0913 06/13/19 1117   06/10/19 1800  piperacillin-tazobactam (ZOSYN) IVPB 3.375 g  Status:  Discontinued     3.375 g 12.5 mL/hr over 240 Minutes Intravenous Every 8 hours 06/10/19 1128 06/14/19 1154   06/10/19 1130  piperacillin-tazobactam (ZOSYN) IVPB 3.375 g     3.375 g 100 mL/hr over 30 Minutes Intravenous  Once 06/10/19 1128 06/10/19 1154   06/10/19 0845  vancomycin (VANCOCIN) IVPB 1000 mg/200 mL premix  Status:  Discontinued     1,000 mg 200 mL/hr over 60 Minutes Intravenous  Once 06/10/19 0833 06/10/19 0836   06/10/19 0845  cefTRIAXone (ROCEPHIN) 2 g in sodium chloride 0.9 % 100 mL IVPB  2 g 200 mL/hr over 30 Minutes Intravenous  Once 06/10/19 0833 06/10/19 0926   06/10/19 0845  clindamycin (CLEOCIN) IVPB 900 mg     900 mg 100 mL/hr over 30 Minutes Intravenous  Once 06/10/19 0834 06/10/19 0926   06/10/19 0845  vancomycin (VANCOCIN) 2,000 mg in sodium chloride 0.9 % 500 mL IVPB     2,000 mg 250 mL/hr over 120 Minutes Intravenous  Once 06/10/19 6004 06/10/19 1153      Alphonsa Overall, MD, [facility] Office: [phone] 06/16/2019

## 2019-06-17 DIAGNOSIS — N493 Fournier gangrene: Secondary | ICD-10-CM

## 2019-06-17 LAB — BASIC METABOLIC PANEL
Anion gap: 10 (ref 5–15)
BUN: <5 mg/dL — ABNORMAL LOW (ref 6–20)
CO2: 25 mmol/L (ref 22–32)
Calcium: 8 mg/dL — ABNORMAL LOW (ref 8.9–10.3)
Chloride: 103 mmol/L (ref 98–111)
Creatinine, Ser: 0.69 mg/dL (ref 0.61–1.24)
GFR calc Af Amer: >60 mL/min (ref >60)
GFR calc non Af Amer: >60 mL/min (ref >60)
Glucose, Bld: 179 mg/dL — ABNORMAL HIGH (ref 70–99)
Potassium: 3.5 mmol/L (ref 3.5–5.1)
Sodium: 138 mmol/L (ref 135–145)

## 2019-06-17 LAB — GLUCOSE, CAPILLARY
Glucose-Capillary: 175 mg/dL — ABNORMAL HIGH (ref 70–99)
Glucose-Capillary: 169 mg/dL — ABNORMAL HIGH (ref 70–99)
Glucose-Capillary: 133 mg/dL — ABNORMAL HIGH (ref 70–99)
Glucose-Capillary: 157 mg/dL — ABNORMAL HIGH (ref 70–99)

## 2019-06-17 LAB — CBC
HCT: 36.3 % — ABNORMAL LOW (ref 39.0–52.0)
Hemoglobin: 11.3 g/dL — ABNORMAL LOW (ref 13.0–17.0)
MCH: 25.7 pg — ABNORMAL LOW (ref 26.0–34.0)
MCHC: 31.1 g/dL (ref 30.0–36.0)
MCV: 82.5 fL (ref 80.0–100.0)
Platelets: 314 10*3/uL (ref 150–400)
RBC: 4.4 MIL/uL (ref 4.22–5.81)
RDW: 14.7 % (ref 11.5–15.5)
WBC: 12.7 10*3/uL — ABNORMAL HIGH (ref 4.0–10.5)
nRBC: 0 % (ref 0.0–0.2)

## 2019-06-17 MED ORDER — NYSTATIN 100000 UNIT/GM EX POWD
Freq: Two times a day (BID) | CUTANEOUS | Status: DC
  Administered 2019-06-17 (×2): via TOPICAL
  Filled 2019-06-17: qty 15

## 2019-06-17 MED ORDER — LORATADINE 10 MG PO TABS
10.0000 mg | ORAL_TABLET | Freq: Every day | ORAL | Status: DC
  Administered 2019-06-17 – 2019-06-18 (×2): 10 mg via ORAL
  Filled 2019-06-17 (×2): qty 1

## 2019-06-17 MED ORDER — NYSTATIN 100000 UNIT/GM EX POWD: Freq: Once | CUTANEOUS | Status: DC

## 2019-06-17 NOTE — Progress Notes (Signed)
Family Medicine Teaching Service Daily Progress Note Intern Pager: [phone]  Patient name: Mario Little Medical record number: [id number] Date of birth: Jan 01, 1976 Age: 43 y.o. Gender: male  Primary Care Provider: Patient, No Pcp Per Consultants: General surgery, urology Code Status: Full  Pt Overview and Major Events to Date:  10/25-patient admitted 10/25-I&D and cystoscopy 10/28-second surgical I&D  Antibiotic course: IV vancomycin: 10/24-10/28 IV Zosyn: 10/24-10/29 IV Ancef: 10/29-present   Assessment and Plan: Mario Little is a 43 y.o. male presenting with groin pain and penile swelling. PMH is significant for chronic back pain, bipolar 1, hypertension.  Groin Abscess   Status post 2 I&D's. Still has improved erythema and swelling in the genital region. Patient says that he pain is 5-6/10, but is controlled with his current pain regimen.  Vital signs of been stable and patient is been afebrile.  Per general surgery's recommendation, plan to continue IV antibiotics until improved swelling erythema and region.  Cultures grew moderate group B strep S.  Agalactiae.  Presumed to be penicillin susceptible.  Blood cultures negative x5 days. -General surgery and urology on board, appreciate recommendation -PRN oxy for pain and MiraLAX daily -IV Unasyn  -PT/OT.  PT recommending home health PT.  OT recommending SNF.  Groin rash Is pruritic.  Appears to possibly be intertrigo given location, recent antibiotic use,  and diabetes, likely has yeast infection.   -Nystatin  -Claritin for itch   New onset-type 2 diabetes CBGs past 24 hrs in the 89-199.  Currently on 35 units Lantus and sliding scale insulin.  -Continue 35 units Lantus -Sensitive sliding scale -On discharge will discuss oral medications -Diabetes education has been ordered  Low TSH: low at 0.108 -f/u outpatient, could be acute phase reactant  Hypokalemia  Mild, potassium 3.3 on 10/31 -Replaced with 40  K-Dur -BMP in morning  Chronic back pain Home medications include Percocet 5-325 every 6 hours as needed, Zanaflex 4 mg as needed, gabapentin 600 mg 3 times daily -cont home oxy -Continue home gabapentin -Continue home Zanaflex as needed  Patient reported he is psych patient: Currently on no home medications.    Not on any home medications.  Unable to find further information via chart review.    Mood stable. -Continue to monitor  FEN/GI: Diabetic diet PPx: Heparin subq  Disposition: Continue IV antibiotics  Subjective:  Patient versus some groin pain.  Does a that is better, but would like one of his PRN medications.  Denies any chest pain, shortness of breath, domino pain or other issues this morning.  Objective: Temp:  [97.8 F (36.6 C)-98.3 F (36.8 C)] 98.2 F (36.8 C) (11/01 0354) Pulse Rate:  [64-72] 64 (11/01 0354) Resp:  [16-18] 18 (11/01 0354) BP: (128-149)/(77-84) 149/84 (11/01 0354) SpO2:  [95 %-100 %] 100 % (11/01 0354)  Physical Exam: General: Alert and oriented in no apparent distress Heart: Regular rate and rhythm with no murmurs appreciated Lungs: CTA bilaterally, no wheezing Abdomen: Bowel sounds present, no abdominal pain GU: Surgical bandage covering incision over inguinal region, patient scrotum and penis still with swelling, pruritic rash between thigh creases bilaterally   Laboratory: Recent Labs  Lab 06/14/19 0234 06/15/19 0211 06/16/19 0335  WBC 15.0* 12.0* 12.4*  HGB 11.8* 12.3* 11.6*  HCT 36.3* 39.7 37.2*  PLT 295 332 315   Recent Labs  Lab 06/10/19 0823  06/14/19 0234 06/15/19 0211 06/16/19 0335  NA 135   < > 134* 140 138  K 3.3*   < >  3.7 3.3* 3.3*  CL 100   < > 103 104 106  CO2 16*   < > 22 25 22   BUN 5*   < > 7 7 7   CREATININE 1.10   < > 0.66 0.63 0.67  CALCIUM 9.0   < > 7.9* 8.0* 8.0*  PROT 6.7  --   --   --   --   BILITOT 1.3*  --   --   --   --   ALKPHOS 114  --   --   --   --   ALT 18  --   --   --   --   AST 14*   --   --   --   --   GLUCOSE 353*   < > 258* 182* 122*   < > = values in this interval not displayed.    Mr Thoracic Spine Wo Contrast  Result Date: 05/25/2019 CLINICAL DATA:  Mid back pain EXAM: MRI THORACIC SPINE WITHOUT CONTRAST TECHNIQUE: Multiplanar, multisequence MR imaging of the thoracic spine was performed. No intravenous contrast was administered. COMPARISON:  X-ray 04/30/2019.  Lumbar spine MRI 04/27/2019 FINDINGS: Technical note: Motion degraded examination, particularly affecting the axial sequences. Alignment:  Physiologic. Vertebrae: No fracture, evidence of discitis, or bone lesion. Cord: No abnormal cord signal is evident within the limitations of this exam. Paraspinal and other soft tissues: Negative. Disc levels: T1-T2: Sagittal sequences only. At least mild bilateral foraminal stenosis. No canal stenosis evident. T2-T3: Disc osteophyte complex resulting in moderate left and mild right foraminal stenosis with mild canal stenosis. T3-T4: Disc osteophyte complex results in mild canal stenosis without foraminal stenosis. T4-T5: Disc osteophyte complex results in mild canal stenosis without foraminal stenosis. T5-T6: Disc osteophyte complex results in mild canal stenosis without foraminal stenosis. T6-T7: Prominent disc osteophyte complex resulting in moderate canal stenosis without significant foraminal stenosis. T7-T8: Disc osteophyte complex results in mild canal stenosis without foraminal stenosis. T8-T9: Left paracentral disc osteophyte complex results in mild left foraminal stenosis and mild canal stenosis. T9-T10: Disc osteophyte complex resulting in mild right greater than left foraminal stenosis with minimal canal stenosis. T10-T11: Disc osteophyte complex resulting in mild bilateral foraminal stenosis without canal stenosis. T11-T12: Diffuse disc osteophyte complex and mild bilateral facet arthrosis results in severe right and moderate left foraminal stenosis without canal stenosis.  IMPRESSION: 1. Extensive thoracic spondylosis with multiple levels of canal stenosis, most pronounced at T6-7 where a prominent disc osteophyte complex results in moderate canal stenosis. 2. Multiple levels of bilateral foraminal stenosis, most pronounced at T11-12 where there is severe right and moderate left foraminal stenosis. Electronically Signed   By: 05/02/2019 M.D.   On: 05/25/2019 11:23   Ct Abdomen Pelvis W Contrast  Result Date: 06/10/2019 CLINICAL DATA:  Clinical concern for groin infection and Fournier gangrene. EXAM: CT ABDOMEN AND PELVIS WITH CONTRAST TECHNIQUE: Multidetector CT imaging of the abdomen and pelvis was performed using the standard protocol following bolus administration of intravenous contrast. CONTRAST:  07/25/2019 OMNIPAQUE IOHEXOL 300 MG/ML  SOLN COMPARISON:  None. FINDINGS: Lower chest: There is mild right basilar atelectasis. Hepatobiliary: No focal liver abnormality is seen. No gallstones, gallbladder wall thickening, or biliary dilatation. Pancreas: Unremarkable. No pancreatic ductal dilatation or surrounding inflammatory changes. Spleen: Normal in size without focal abnormality. Adrenals/Urinary Tract: A left adrenal mass measures 2.1 cm and measures less than 10 Hounsfield units, consistent with a benign adrenal adenoma or myelolipoma. Kidneys are normal, without renal calculi, focal  lesion, or hydronephrosis. Bladder is unremarkable. Stomach/Bowel: Stomach is within normal limits. Appendix appears normal. No evidence of bowel wall thickening, distention, or inflammatory changes. Vascular/Lymphatic: No significant vascular findings are present. No enlarged abdominal lymph nodes. Reproductive: Prostate is unremarkable. Other: There is soft tissue edema and disorganized fluid with subcutaneous emphysema and overlying skin thickening involving the groin, concerning for Fournier's gangrene. The penis and scrotum are incompletely imaged on this exam. Multiple enlarged bilateral  inguinal and iliac chain lymph nodes are likely reactive. There is no involvement of the perianal region or retroperitoneal/intraperitoneal extension. There is no well-defined rim enhancing fluid collection to suggest abscess. No abdominal wall hernia. No abdominopelvic ascites. Musculoskeletal: No focal osseous demineralization to suggest osteomyelitis. Degenerative changes are seen in both hips and the lumbar spine. IMPRESSION: Soft tissue edema with subcutaneous emphysema and overlying skin thickening involving the groin is consistent with Fournier gangrene. Of note, the penis and scrotum are incompletely imaged. No well-defined fluid collection to suggest abscess. No involvement of the perianal region. No evidence of retroperitoneal or intraperitoneal extension. Bilateral inguinal and iliac chain lymph nodes are likely reactive. Electronically Signed   By: Zerita Boers M.D.   On: 06/10/2019 10:56     Bonnita Hollow, MD 06/17/2019, 6:17 AM PGY-3, [place] Intern pager: [phone], text pages welcome

## 2019-06-17 NOTE — Progress Notes (Addendum)
[facility] Office:  [phone] General Surgery Progress Note   LOS: 7 days  POD -  4 Days Post-Op  Chief Complaint: Suprapubic abscess  Assessment and Plan: 1.  IRRIGATION AND DEBRIDEMENT MONS ABSCESS - 10/25 - [place],  Cysto - [place] - 10/25, 10/28 - [place]  On zosyn10/25>>10/29, unasyn 10/30 >>>  Can get in shower - he did not want to try yesterday  Wound is looking cleaner.  Okay to discharge from our standpoint at any time.  He will need HHC to assist with dressing changes.  2.  DM  HgbA1C - >15 - 06/11/2019 3.  Right hip pain/back pain       This limits his activity 4.  Obese 5.  Unclear psych history 6.  DVT prophylaxis - SQ heparin   Active Problems:   Groin abscess   Type 2 diabetes mellitus with hyperglycemia (HCC)   Hyperglycemia   Morbid obesity ([place])   Fournier's gangrene in male  Subjective:  Doing okay.  Did not try to get in the shower yesterday.  He plans to go home.  His father is at the house.  Objective:   Vitals:   06/16/19 2134 06/17/19 0354  BP: 137/77 (!) 149/84  Pulse: 69 64  Resp: 18 18  Temp: 98.3 F (36.8 C) 98.2 F (36.8 C)  SpO2: 98% 100%     Intake/Output from previous day:  10/31 0701 - 11/01 0700 In: 1260 [P.O.:960; IV Piggyback:300] Out: 2450 [Urine:2450]  Intake/Output this shift:  No intake/output data recorded.   Physical Exam:   General: Obese AA M who is alert and oriented.    HEENT: Normal. Pupils equal. .   Lungs: Clear   Abdomen: Soft   Wound: Suprapubic wound looks cleaner.   Lab Results:    Recent Labs    06/16/19 0335 06/17/19 0804  WBC 12.4* 12.7*  HGB 11.6* 11.3*  HCT 37.2* 36.3*  PLT 315 314    BMET   Recent Labs    06/16/19 0335 06/17/19 0804  NA 138 138  K 3.3* 3.5  CL 106 103  CO2 22 25  GLUCOSE 122* 179*  BUN 7 <5*  CREATININE 0.67 0.69  CALCIUM 8.0* 8.0*    PT/INR  No results for input(s): LABPROT, INR in the last 72 hours.  ABG  No results for input(s):  PHART, HCO3 in the last 72 hours.  Invalid input(s): PCO2, PO2   Studies/Results:  No results found.   Anti-infectives:   Anti-infectives (From admission, onward)   Start     Dose/Rate Route Frequency Ordered Stop   06/14/19 1600  Ampicillin-Sulbactam (UNASYN) 3 g in sodium chloride 0.9 % 100 mL IVPB     3 g 200 mL/hr over 30 Minutes Intravenous Every 6 hours 06/14/19 1427     06/10/19 2200  vancomycin (VANCOCIN) 1,500 mg in sodium chloride 0.9 % 500 mL IVPB  Status:  Discontinued     1,500 mg 250 mL/hr over 120 Minutes Intravenous Every 12 hours 06/10/19 0913 06/13/19 1117   06/10/19 1800  piperacillin-tazobactam (ZOSYN) IVPB 3.375 g  Status:  Discontinued     3.375 g 12.5 mL/hr over 240 Minutes Intravenous Every 8 hours 06/10/19 1128 06/14/19 1154   06/10/19 1130  piperacillin-tazobactam (ZOSYN) IVPB 3.375 g     3.375 g 100 mL/hr over 30 Minutes Intravenous  Once 06/10/19 1128 06/10/19 1154   06/10/19 0845  vancomycin (VANCOCIN) IVPB 1000 mg/200 mL premix  Status:  Discontinued  1,000 mg 200 mL/hr over 60 Minutes Intravenous  Once 06/10/19 0833 06/10/19 0836   06/10/19 0845  cefTRIAXone (ROCEPHIN) 2 g in sodium chloride 0.9 % 100 mL IVPB     2 g 200 mL/hr over 30 Minutes Intravenous  Once 06/10/19 0833 06/10/19 0926   06/10/19 0845  clindamycin (CLEOCIN) IVPB 900 mg     900 mg 100 mL/hr over 30 Minutes Intravenous  Once 06/10/19 0834 06/10/19 0926   06/10/19 0845  vancomycin (VANCOCIN) 2,000 mg in sodium chloride 0.9 % 500 mL IVPB     2,000 mg 250 mL/hr over 120 Minutes Intravenous  Once 06/10/19 7846 06/10/19 1153      [name], MD, [facility] Office: [phone] 06/17/2019

## 2019-06-17 NOTE — Progress Notes (Signed)
Pharmacy Antibiotic Note  Mario Little is a 43 y.o. male admitted on 06/10/2019 with sepsis and cellulitis, found to have groin abscess. Pt s/p I&D x2 (10/25 and 10/28). After empiric treatment with broad antibiotics, Pharmacy has been consulted for Unasyn dosing.  Today, patient is afebrile, WBC stable at 12.7, SCr stable at baseline. There is still erythema and swelling in the genital region, but this has improved since yesterday. Patient also experiencing persistent pain but controlled with current pain regimen. Per surgeon, we will need to continue IV abx until erythema is gone.  Plan: Unasyn IV 2g q6h Monitor clinical progress, c/s, renal function F/u de-escalation plan/LOT, transition to PO once erythema subsides  Height: 6\' 1"  (185.4 cm) Weight: (!) 325 lb (147.4 kg) IBW/kg (Calculated) : 79.9  Temp (24hrs), Avg:98.1 F (36.7 C), Min:97.8 F (36.6 C), Max:98.3 F (36.8 C)  Recent Labs  Lab 06/13/19 0118 06/13/19 1026 06/14/19 0234 06/15/19 0211 06/16/19 0335 06/17/19 0804  WBC 13.3*  --  15.0* 12.0* 12.4* 12.7*  CREATININE 0.75  --  0.66 0.63 0.67 0.69  VANCOTROUGH  --  7*  --   --   --   --   VANCOPEAK 19*  --   --   --   --   --     Estimated Creatinine Clearance: 183.7 mL/min (by C-G formula based on SCr of 0.69 mg/dL).    No Known Allergies    10/25: Blood Cxs: NG 10/25: Abscess Cxs: GPC, GNR, GPR -Group B strep & prevotella 10/26 UCx: multiple species, needs recollect 10/27 mrsa pcr positive  10/29 Unasyn>> 10/25 Zosyn>>10/29 10/25 Vancomycin>>10/28 10/25 Clindamycin x 1 10/25 Ceftriaxone x 1   Brendolyn Patty, PharmD PGY2 Pharmacy Resident Phone [phone]  06/17/2019   9:18 AM

## 2019-06-17 NOTE — Progress Notes (Signed)
Inpatient Diabetes Program Recommendations  AACE/ADA: New Consensus Statement on Inpatient Glycemic Control   Target Ranges:  Prepandial:   less than 140 mg/dL      Peak postprandial:   less than 180 mg/dL (1-2 hours)      Critically ill patients:  140 - 180 mg/dL   Results for Mario Little, Mario Little (MRN [id number]) as of 06/17/2019 09:39  Ref. Range 06/16/2019 08:09 06/16/2019 12:12 06/16/2019 17:00 06/16/2019 21:30 06/17/2019 07:29  Glucose-Capillary Latest Ref Range: 70 - 99 mg/dL 89 199 (H) 121 (H) 127 (H) 175 (H)   Review of Glycemic Control  Diabetes history: NO Outpatient Diabetes medications: NA Current orders for Inpatient glycemic control: Lantus 25 units daily, NOvolog 0-15 units TID with meals, Novolog 0-5 units QHS  NOTE: Noted consult for Diabetes Coordinator. Diabetes Coordinator is not on campus over the weekend but available by pager from 8am to 5pm for questions or concerns. Patient was seen by Diabetes Coordinator on 06/11/19 and again on 06/14/19 regarding new DM dx and insulin. Patient prefers to use insulin pens if discharged on insulin.  If patient is discharged on insulin please provide Rx for:  1. Insulin Pens (Lantus SoloStar # [id number], Novolog FlexPen # [id number]) 2. Insulin Pen Needles (# [id number]) 3. Glucometer Kit [id number]# [id number])  Thanks, Barnie Alderman, RN, MSN, CDE Diabetes Coordinator Inpatient Diabetes Program [phone] (Team Pager from 8am to 5pm)

## 2019-06-18 LAB — CBC
HCT: 36.4 % — ABNORMAL LOW (ref 39.0–52.0)
Hemoglobin: 11.4 g/dL — ABNORMAL LOW (ref 13.0–17.0)
MCH: 26.3 pg (ref 26.0–34.0)
MCHC: 31.3 g/dL (ref 30.0–36.0)
MCV: 84.1 fL (ref 80.0–100.0)
Platelets: 328 10*3/uL (ref 150–400)
RBC: 4.33 MIL/uL (ref 4.22–5.81)
RDW: 14.8 % (ref 11.5–15.5)
WBC: 13.3 10*3/uL — ABNORMAL HIGH (ref 4.0–10.5)
nRBC: 0 % (ref 0.0–0.2)

## 2019-06-18 LAB — BASIC METABOLIC PANEL
Anion gap: 8 (ref 5–15)
BUN: 10 mg/dL (ref 6–20)
CO2: 25 mmol/L (ref 22–32)
Calcium: 8.1 mg/dL — ABNORMAL LOW (ref 8.9–10.3)
Chloride: 105 mmol/L (ref 98–111)
Creatinine, Ser: 0.77 mg/dL (ref 0.61–1.24)
GFR calc Af Amer: >60 mL/min (ref >60)
GFR calc non Af Amer: >60 mL/min (ref >60)
Glucose, Bld: 180 mg/dL — ABNORMAL HIGH (ref 70–99)
Potassium: 3.9 mmol/L (ref 3.5–5.1)
Sodium: 138 mmol/L (ref 135–145)

## 2019-06-18 LAB — GLUCOSE, CAPILLARY
Glucose-Capillary: 109 mg/dL — ABNORMAL HIGH (ref 70–99)
Glucose-Capillary: 162 mg/dL — ABNORMAL HIGH (ref 70–99)

## 2019-06-18 MED ORDER — INSULIN ASPART 100 UNIT/ML ~~LOC~~ SOLN: SUBCUTANEOUS | 11 refills | Status: AC

## 2019-06-18 MED ORDER — ROSUVASTATIN CALCIUM 5 MG PO TABS: 5.0000 mg | ORAL_TABLET | Freq: Every day | ORAL | 0 refills | Status: AC

## 2019-06-18 MED ORDER — NYSTATIN 100000 UNIT/GM EX POWD: Freq: Two times a day (BID) | CUTANEOUS | 0 refills | Status: AC

## 2019-06-18 MED ORDER — LORATADINE 10 MG PO TABS: 10.0000 mg | ORAL_TABLET | Freq: Every day | ORAL | 0 refills | Status: AC

## 2019-06-18 MED ORDER — AMOXICILLIN-POT CLAVULANATE 875-125 MG PO TABS: 1.0000 | ORAL_TABLET | Freq: Two times a day (BID) | ORAL | 0 refills | Status: AC

## 2019-06-18 MED ORDER — INSULIN STARTER KIT- PEN NEEDLES (ENGLISH): 1.0000 | Freq: Once | 0 refills | Status: AC

## 2019-06-18 MED ORDER — AMOXICILLIN-POT CLAVULANATE 875-125 MG PO TABS
1.0000 | ORAL_TABLET | Freq: Two times a day (BID) | ORAL | Status: DC
  Administered 2019-06-18: 12:00:00 1 via ORAL
  Filled 2019-06-18: qty 1

## 2019-06-18 MED ORDER — INSULIN ASPART 100 UNIT/ML ~~LOC~~ SOLN: 0.0000 [IU] | Freq: Every day | SUBCUTANEOUS | 11 refills | Status: DC

## 2019-06-18 MED ORDER — METFORMIN HCL ER 500 MG PO TB24: 500.0000 mg | ORAL_TABLET | Freq: Every day | ORAL | 11 refills | Status: DC

## 2019-06-18 MED ORDER — METFORMIN HCL ER 500 MG PO TB24: 500.0000 mg | ORAL_TABLET | Freq: Every day | ORAL | 11 refills | Status: AC

## 2019-06-18 MED ORDER — OXYCODONE HCL 10 MG PO TABS: 10.0000 mg | ORAL_TABLET | Freq: Four times a day (QID) | ORAL | 0 refills | Status: AC | PRN

## 2019-06-18 MED ORDER — INSULIN GLARGINE 100 UNIT/ML ~~LOC~~ SOLN: 35.0000 [IU] | Freq: Every day | SUBCUTANEOUS | 11 refills | Status: AC

## 2019-06-18 MED ORDER — INSULIN ASPART 100 UNIT/ML ~~LOC~~ SOLN: 0.0000 [IU] | Freq: Three times a day (TID) | SUBCUTANEOUS | 11 refills | Status: DC

## 2019-06-18 MED ORDER — ACCU-CHEK AVIVA PLUS W/DEVICE KIT: 1.0000 | PACK | Freq: Three times a day (TID) | 0 refills | Status: AC

## 2019-06-18 MED FILL — AMOX-CLAV 875-125 MG TABLET: 875-125 | 7 days supply | Qty: 13 | Fill #0

## 2019-06-18 MED FILL — LANTUS SOLOSTAR 100 UNITS/M: 100 | 34 days supply | Qty: 12 | Fill #0

## 2019-06-18 MED FILL — LORATADINE 10 MG TABLET: 10 | 30 days supply | Qty: 30 | Fill #0

## 2019-06-18 MED FILL — NOVOLOG FLEXPEN SYRINGE: 100 | 30 days supply | Qty: 15 | Fill #0

## 2019-06-18 MED FILL — oxyCODONE HCL 10 MG TABS: 10 | 5 days supply | Qty: 20 | Fill #0

## 2019-06-18 MED FILL — ROSUVASTATIN CALCIUM 5 MG T: 5 | 30 days supply | Qty: 30 | Fill #0

## 2019-06-18 MED FILL — NYSTATIN 100,000 UNIT/GM CR: 100000 | 7 days supply | Qty: 30 | Fill #0

## 2019-06-18 MED FILL — PENTIPS 31G X 8 MM MISC: 31G X 8 MM | 30 days supply | Qty: 200 | Fill #0

## 2019-06-18 MED FILL — METFORMIN HCL ER 500 MG TB2: 500 | 34 days supply | Qty: 60 | Fill #0

## 2019-06-18 NOTE — Progress Notes (Addendum)
Transitioned to augmentin from unasyn this am. Discharge medications sent to [facility]. Patient interested in learning more about the "360 program". Have asked Social work to go speak with patient about this. Plan of care relayed to nursing staff. Ok for discharge once has medication in hand and has spoken to social work. Discharge summary to follow.  Guadalupe Dawn MD PGY-3 Family Medicine Resident

## 2019-06-18 NOTE — Progress Notes (Signed)
Discharge home. Home discharge instruction given, no question verbaized. 

## 2019-06-18 NOTE — Plan of Care (Signed)
  Problem: Health Behavior/Discharge Planning: Goal: Ability to manage health-related needs will improve Outcome: Completed/Met   Problem: Clinical Measurements: Goal: Ability to maintain clinical measurements within normal limits will improve Outcome: Completed/Met Goal: Will remain free from infection Outcome: Completed/Met Goal: Diagnostic test results will improve Outcome: Completed/Met Goal: Respiratory complications will improve Outcome: Completed/Met Goal: Cardiovascular complication will be avoided Outcome: Completed/Met   Problem: Activity: Goal: Risk for activity intolerance will decrease Outcome: Completed/Met   Problem: Nutrition: Goal: Adequate nutrition will be maintained Outcome: Completed/Met   Problem: Coping: Goal: Level of anxiety will decrease Outcome: Completed/Met   Problem: Elimination: Goal: Will not experience complications related to bowel motility Outcome: Completed/Met Goal: Will not experience complications related to urinary retention Outcome: Completed/Met   Problem: Pain Managment: Goal: General experience of comfort will improve Outcome: Completed/Met   Problem: Safety: Goal: Ability to remain free from injury will improve Outcome: Completed/Met   Problem: Skin Integrity: Goal: Risk for impaired skin integrity will decrease Outcome: Completed/Met   Problem: Education: Goal: Required Educational Video(s) Outcome: Completed/Met   Problem: Clinical Measurements: Goal: Ability to maintain clinical measurements within normal limits will improve Outcome: Completed/Met Goal: Postoperative complications will be avoided or minimized Outcome: Completed/Met   Problem: Skin Integrity: Goal: Demonstration of wound healing without infection will improve Outcome: Completed/Met

## 2019-06-18 NOTE — TOC Progression Note (Signed)
Transition of Care [facility]) - Progression Note    Patient Details  Name: Mario Little MRN: [id number] Date of Birth: [birth year]  Transition of Care [facility]) CM/SW Contact  Sharlet Salina Mila Homer, LCSW Phone Number: 06/18/2019, 1:49 PM  Clinical Narrative: CSW received a call from patient's nurse and was advised that he wanted information on [facility]. Checked notes and the unit CSW had talked with patient and made a referral through [facility]. Visited with patient and provided update. Patient wants to apply for Section [employer], but has been told that the list is closed and he also wants to apply for public housing and this was discussed. CSW encouraged patient to contact [facility]) and inquire about their application process at this time; and to also continue to follow-up with Section [employer]. [name] reported that he lives with his father and pays rent and also receives food stamps. Patient expressed that he is a man and wants to be on his own and this was discussed. No other needs expressed by [name].      Expected Discharge Plan: [place] Barriers to Discharge: Continued Medical Work up  Expected Discharge Plan and Services Expected Discharge Plan: [place]   Discharge Planning Services: CM Consult Post Acute Care Choice: [place] arrangements for the past 2 months: Single Family Home                 DME Arranged: N/A         HH Arranged: RN           Social Determinants of Health (SDOH) Interventions  Patient lives with his father and is seeking his own housing. He has been provided with resources by unit CSW and a referral made through [place] [facility].  Readmission Risk Interventions No flowsheet data found.

## 2019-06-18 NOTE — TOC Transition Note (Signed)
Transition of Care [facility]) - CM/SW Discharge Note   Patient Details  Name: Mario Little MRN: [id number] Date of Birth: 1976/06/21  Transition of Care [facility]) CM/SW Contact:  Marilu Favre, RN Phone Number: 06/18/2019, 4:37 PM   Clinical Narrative:      Kerry Dory with [facility] aware discharge is today. Ordered tub bench and walker patient wants [employer] to ship it to his home. Patient aware shipment will take 2 to 3 days   Barriers to Discharge: Continued Medical Work up   Patient Goals and CMS Choice Patient states their goals for this hospitalization and ongoing recovery are:: to go home CMS Medicare.gov Compare Post Acute Care list provided to:: Patient Choice offered to / list presented to : Patient  Discharge Placement                       Discharge Plan and Services   Discharge Planning Services: CM Consult Post Acute Care Choice: Home Health          DME Arranged: N/A         HH Arranged: RN          Social Determinants of Health (SDOH) Interventions     Readmission Risk Interventions No flowsheet data found.

## 2019-06-18 NOTE — Progress Notes (Signed)
Occupational Therapy Treatment Patient Details Name: Mario Little MRN: [id number] DOB: 01/13/1976 Today's Date: 06/18/2019    History of present illness 43yo male with groin pain and penile edema, CT consistent with Fournier's gangrene. Received I&D of mons on 10/25 and 10/29. PMH PTDS, multiple personality disorder, intermittent explosive disorder, HTN, DM, bipolar, chonic back pain   OT comments  Pt progressing towards established OT goals and demonstrating increased activity tolerance and performance. Pt performing LB dressing and functional mobility at [facility] A level for safety. Educating pt on compensatory techniques for LB ADLs. Educating pt on tub transfer techniques and feel pt would benefit from tub transfer bench for safety. Recommend dc to home with HHOT and will continue to follow acutely as admitted.   Assessed and discussed scrotal sling options. Feel pt would benefit from wearing underwear at home vs sling. Providing education on edema management with elevation and compression.   Follow Up Recommendations  Home health OT;Supervision/Assistance - 24 hour    Equipment Recommendations  Tub/shower bench    Recommendations for Other Services      Precautions / Restrictions Precautions Precautions: Fall;Other (comment) Restrictions Weight Bearing Restrictions: No       Mobility Bed Mobility Overal bed mobility: Needs Assistance Bed Mobility: Supine to Sit     Supine to sit: Min guard;HOB elevated     General bed mobility comments: Min GUard A for safety and increased time  Transfers Overall transfer level: Needs assistance Equipment used: Rolling walker (2 wheeled) Transfers: Sit to/from Stand Sit to Stand: Min guard         General transfer comment: Min Guard A for safety    Balance Overall balance assessment: Needs assistance Sitting-balance support: No upper extremity supported;Feet supported Sitting balance-Leahy Scale: Fair     Standing  balance support: No upper extremity supported;Bilateral upper extremity supported;During functional activity Standing balance-Leahy Scale: Fair                             ADL either performed or assessed with clinical judgement   ADL Overall ADL's : Needs assistance/impaired                     Lower Body Dressing: Min guard;Sit to/from stand Lower Body Dressing Details (indicate cue type and reason): able to don pants with increased time and effort, required min guard A for safety           Tub/Shower Transfer Details (indicate cue type and reason): Pt with difficulty lifting BLEs over tub height. Educating pt on use of tub bench to assist with tub transfer.  Functional mobility during ADLs: Min guard;Rolling walker General ADL Comments: Pt performing LB dressing and functional mobility with MIn Guard A.      Vision       Perception     Praxis      Cognition Arousal/Alertness: Awake/alert Behavior During Therapy: WFL for tasks assessed/performed Overall Cognitive Status: Within Functional Limits for tasks assessed                                          Exercises Exercises: Other exercises Other Exercises Other Exercises: Educating pt on use of underwear for elevation and compression. discussed pros-cons of scrotal sling but feel this is place pressure over wound.    Shoulder Instructions  General Comments Pt continues to present with increased edema at penis and scrotum.     Pertinent Vitals/ Pain       Pain Assessment: 0-10 Pain Score: 6  Pain Location: groin, back, hips Pain Descriptors / Indicators: Aching;Grimacing;Sharp;Moaning Pain Intervention(s): Limited activity within patient's tolerance;Monitored during session;Repositioned  Home Living                                          Prior Functioning/Environment              Frequency  Min 2X/week        Progress Toward  Goals  OT Goals(current goals can now be found in the care plan section)  Progress towards OT goals: Progressing toward goals  Acute Rehab OT Goals Patient Stated Goal: less pain, wound to heal OT Goal Formulation: With patient Time For Goal Achievement: 06/29/19 Potential to Achieve Goals: Good ADL Goals Pt Will Perform Grooming: with supervision;standing Pt Will Perform Lower Body Bathing: with supervision;sitting/lateral leans;sit to/from stand;with adaptive equipment Pt Will Perform Lower Body Dressing: with supervision;with adaptive equipment;sitting/lateral leans;sit to/from stand Pt Will Transfer to Toilet: with supervision;ambulating Pt Will Perform Toileting - Clothing Manipulation and hygiene: with supervision;sit to/from stand  Plan Discharge plan needs to be updated;Frequency remains appropriate    Co-evaluation                 AM-PAC OT "6 Clicks" Daily Activity     Outcome Measure   Help from another person eating meals?: None Help from another person taking care of personal grooming?: A Little Help from another person toileting, which includes using toliet, bedpan, or urinal?: A Lot Help from another person bathing (including washing, rinsing, drying)?: A Lot Help from another person to put on and taking off regular upper body clothing?: None Help from another person to put on and taking off regular lower body clothing?: A Lot 6 Click Score: 17    End of Session Equipment Utilized During Treatment: Rolling walker  OT Visit Diagnosis: Other abnormalities of gait and mobility (R26.89);Pain Pain - part of body: (groin, back)   Activity Tolerance Patient tolerated treatment well   Patient Left in chair;with call bell/phone within reach   Nurse Communication Mobility status        Time: 1334-1401 OT Time Calculation (min): 27 min  Charges: OT General Charges $OT Visit: 1 Visit OT Treatments $Self Care/Home Management : 23-37 mins  Niasia Lanphear MSOT, OTR/L Acute Rehab Pager: [phone] Office: [phone]   [name] 06/18/2019, 2:35 PM

## 2019-06-18 NOTE — Progress Notes (Signed)
5 Days Post-Op  Subjective: CC: Patient reports some soreness around his incision site. Tolerating dressing changes at bedside. No other complaints. Wants to go home.   Objective: Vital signs in last 24 hours: Temp:  [98 F (36.7 C)-98.3 F (36.8 C)] 98.3 F (36.8 C) (11/02 0530) Pulse Rate:  [65-73] 65 (11/02 0530) Resp:  [16-18] 18 (11/02 0530) BP: (137-151)/(82-94) 137/82 (11/02 0530) SpO2:  [96 %-100 %] 96 % (11/02 0530) Last BM Date: 06/16/19  Intake/Output from previous day: 11/01 0701 - 11/02 0700 In: 590 [P.O.:590] Out: 800 [Urine:800] Intake/Output this shift: No intake/output data recorded.  PE: Gen: Obese male who is awake and alert, in NAD Lungs: Normal rate and effort Abd: Soft, ND, NT, +BS Wound: Suprapubic wound clean with healthy granulation tissue at the base. No purulent drainage.   Lab Results:  Recent Labs    06/17/19 0804 06/18/19 0235  WBC 12.7* 13.3*  HGB 11.3* 11.4*  HCT 36.3* 36.4*  PLT 314 328   BMET Recent Labs    06/17/19 0804 06/18/19 0235  NA 138 138  K 3.5 3.9  CL 103 105  CO2 25 25  GLUCOSE 179* 180*  BUN <5* 10  CREATININE 0.69 0.77  CALCIUM 8.0* 8.1*   PT/INR No results for input(s): LABPROT, INR in the last 72 hours. CMP     Component Value Date/Time   NA 138 06/18/2019 0235   K 3.9 06/18/2019 0235   CL 105 06/18/2019 0235   CO2 25 06/18/2019 0235   GLUCOSE 180 (H) 06/18/2019 0235   BUN 10 06/18/2019 0235   CREATININE 0.77 06/18/2019 0235   CALCIUM 8.1 (L) 06/18/2019 0235   PROT 6.7 06/10/2019 0823   ALBUMIN 2.6 (L) 06/10/2019 0823   AST 14 (L) 06/10/2019 0823   ALT 18 06/10/2019 0823   ALKPHOS 114 06/10/2019 0823   BILITOT 1.3 (H) 06/10/2019 0823   GFRNONAA >60 06/18/2019 0235   GFRAA >60 06/18/2019 0235   Lipase  No results found for: LIPASE     Studies/Results: No results found.  Anti-infectives: Anti-infectives (From admission, onward)   Start     Dose/Rate Route Frequency Ordered Stop    06/14/19 1600  Ampicillin-Sulbactam (UNASYN) 3 g in sodium chloride 0.9 % 100 mL IVPB     3 g 200 mL/hr over 30 Minutes Intravenous Every 6 hours 06/14/19 1427     06/10/19 2200  vancomycin (VANCOCIN) 1,500 mg in sodium chloride 0.9 % 500 mL IVPB  Status:  Discontinued     1,500 mg 250 mL/hr over 120 Minutes Intravenous Every 12 hours 06/10/19 0913 06/13/19 1117   06/10/19 1800  piperacillin-tazobactam (ZOSYN) IVPB 3.375 g  Status:  Discontinued     3.375 g 12.5 mL/hr over 240 Minutes Intravenous Every 8 hours 06/10/19 1128 06/14/19 1154   06/10/19 1130  piperacillin-tazobactam (ZOSYN) IVPB 3.375 g     3.375 g 100 mL/hr over 30 Minutes Intravenous  Once 06/10/19 1128 06/10/19 1154   06/10/19 0845  vancomycin (VANCOCIN) IVPB 1000 mg/200 mL premix  Status:  Discontinued     1,000 mg 200 mL/hr over 60 Minutes Intravenous  Once 06/10/19 0833 06/10/19 0836   06/10/19 0845  cefTRIAXone (ROCEPHIN) 2 g in sodium chloride 0.9 % 100 mL IVPB     2 g 200 mL/hr over 30 Minutes Intravenous  Once 06/10/19 0833 06/10/19 0926   06/10/19 0845  clindamycin (CLEOCIN) IVPB 900 mg     900 mg 100 mL/hr over  30 Minutes Intravenous  Once 06/10/19 0834 06/10/19 0926   06/10/19 0845  vancomycin (VANCOCIN) 2,000 mg in sodium chloride 0.9 % 500 mL IVPB     2,000 mg 250 mL/hr over 120 Minutes Intravenous  Once 06/10/19 0836 06/10/19 1153       Assessment/Plan DM - HgbA1C - >15 - 06/11/2019   S/p IRRIGATION AND DEBRIDEMENT MONS ABSCESS - 10/25 - [place],  Cysto - [place] - 10/25, 10/28 - White - POD #8 &# 5 - Wound is looking cleaner.  Okay to discharge from our standpoint at any time. No further plans for OR. He will need HHC to assist with dressing changes. He can get in the shower. We will follow peripherally   FEN - CM VTE - Sq Heparin  ID - On zosyn10/25>>10/29, unasyn 10/30 >>>. Recommend 14 days of abx    LOS: 8 days    Jillyn Ledger , [facility] 06/18/2019, 10:56 AM Please  see Amion for pager number during day hours 7:00am-4:30pm

## 2019-08-01 ENCOUNTER — Ambulatory Visit: Payer: Medicaid Other | Admitting: Registered"

## 2020-09-14 ENCOUNTER — Other Ambulatory Visit: Payer: Self-pay | Admitting: Family Medicine

## 2021-08-16 IMAGING — DX DG THORACIC SPINE 2V
3 series · 3 of 3 positions shown · non-contrast
Comparison: None.

CLINICAL DATA: Back pain

EXAM:
THORACIC SPINE 2 VIEWS

[dg thoracic spine 2 view (1 of 3)]
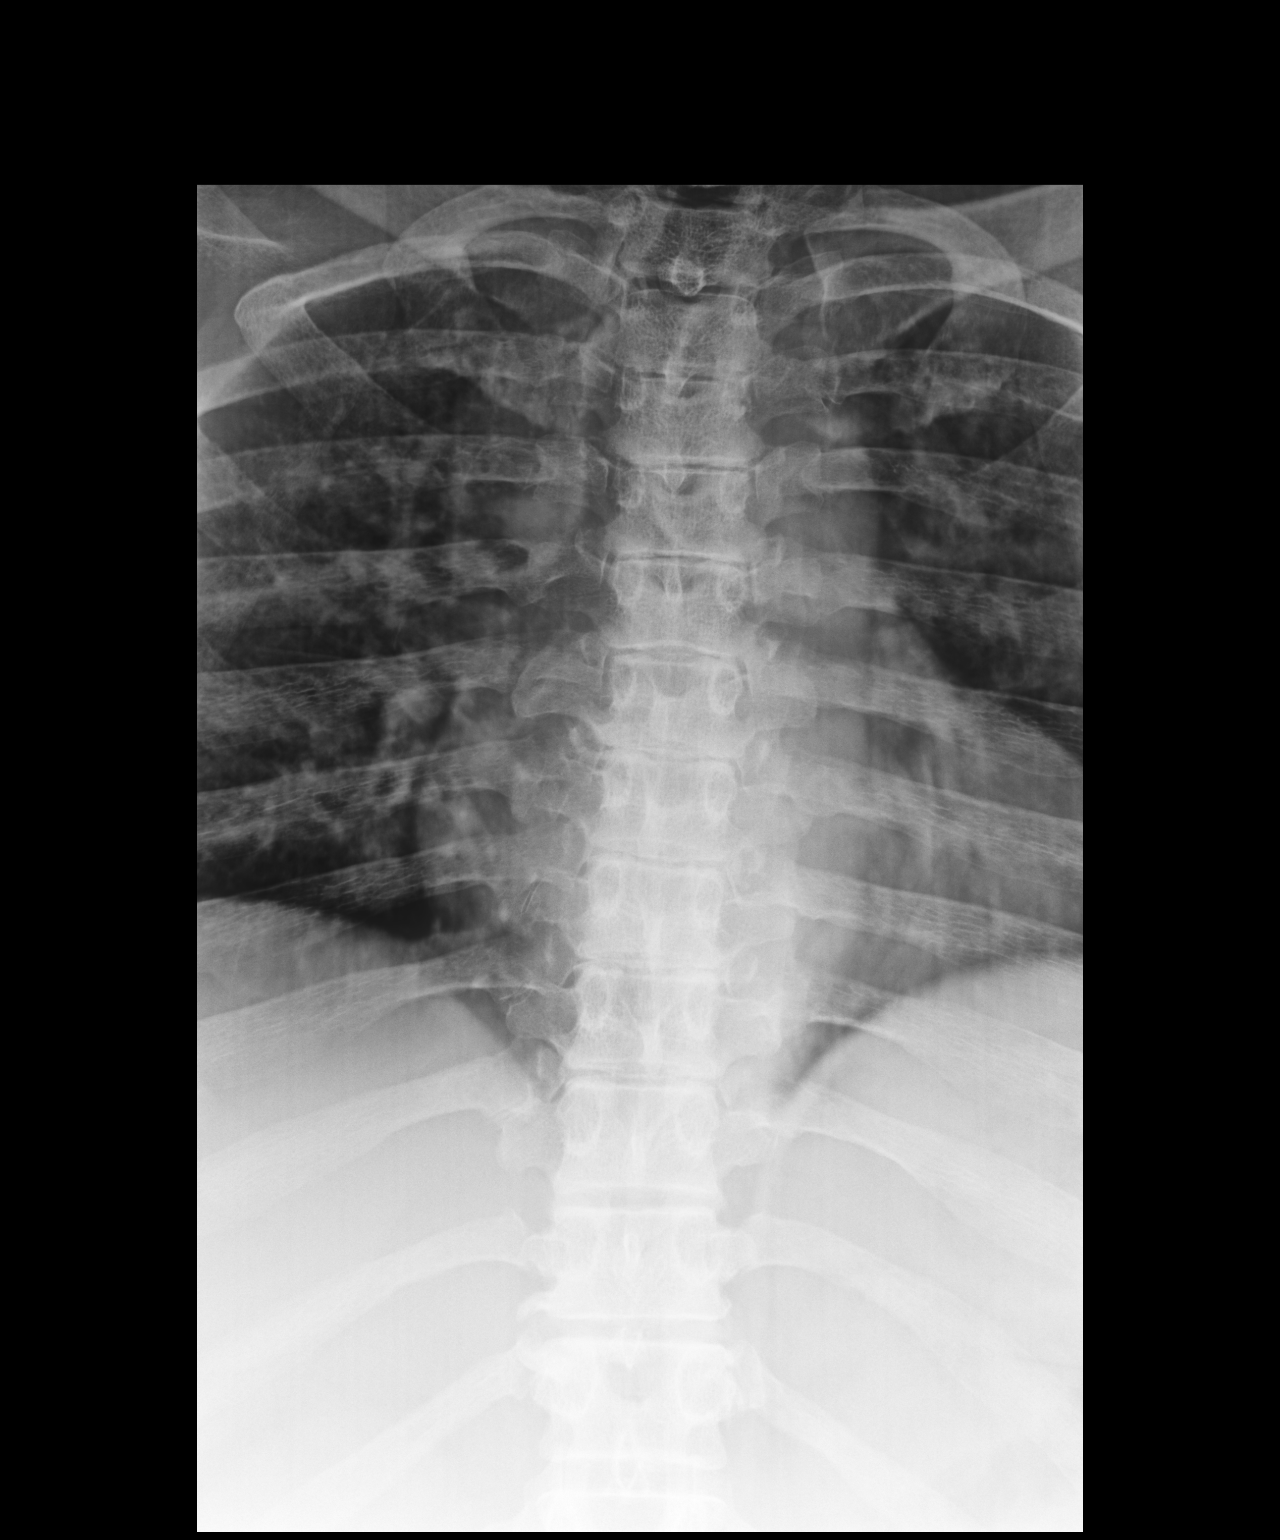

[dg thoracic spine 2 view (2 of 3)]
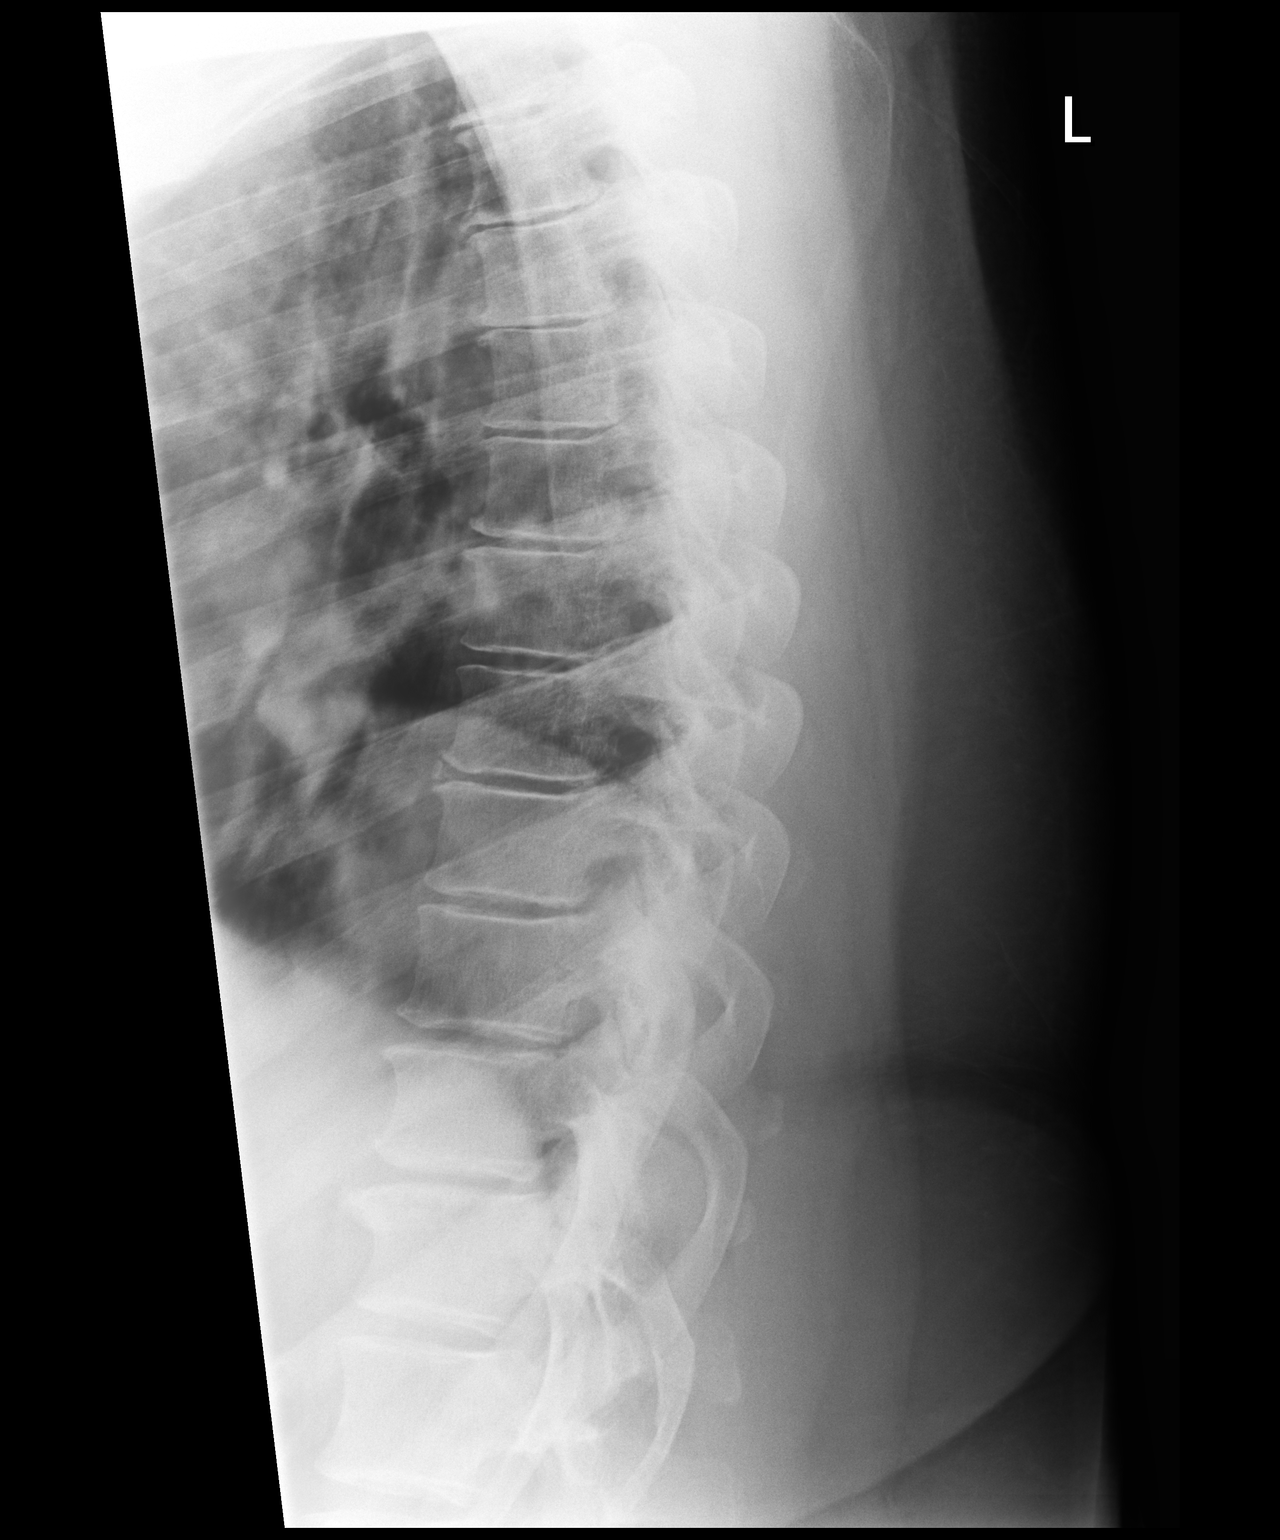

[dg thoracic spine 2 view (3 of 3)]
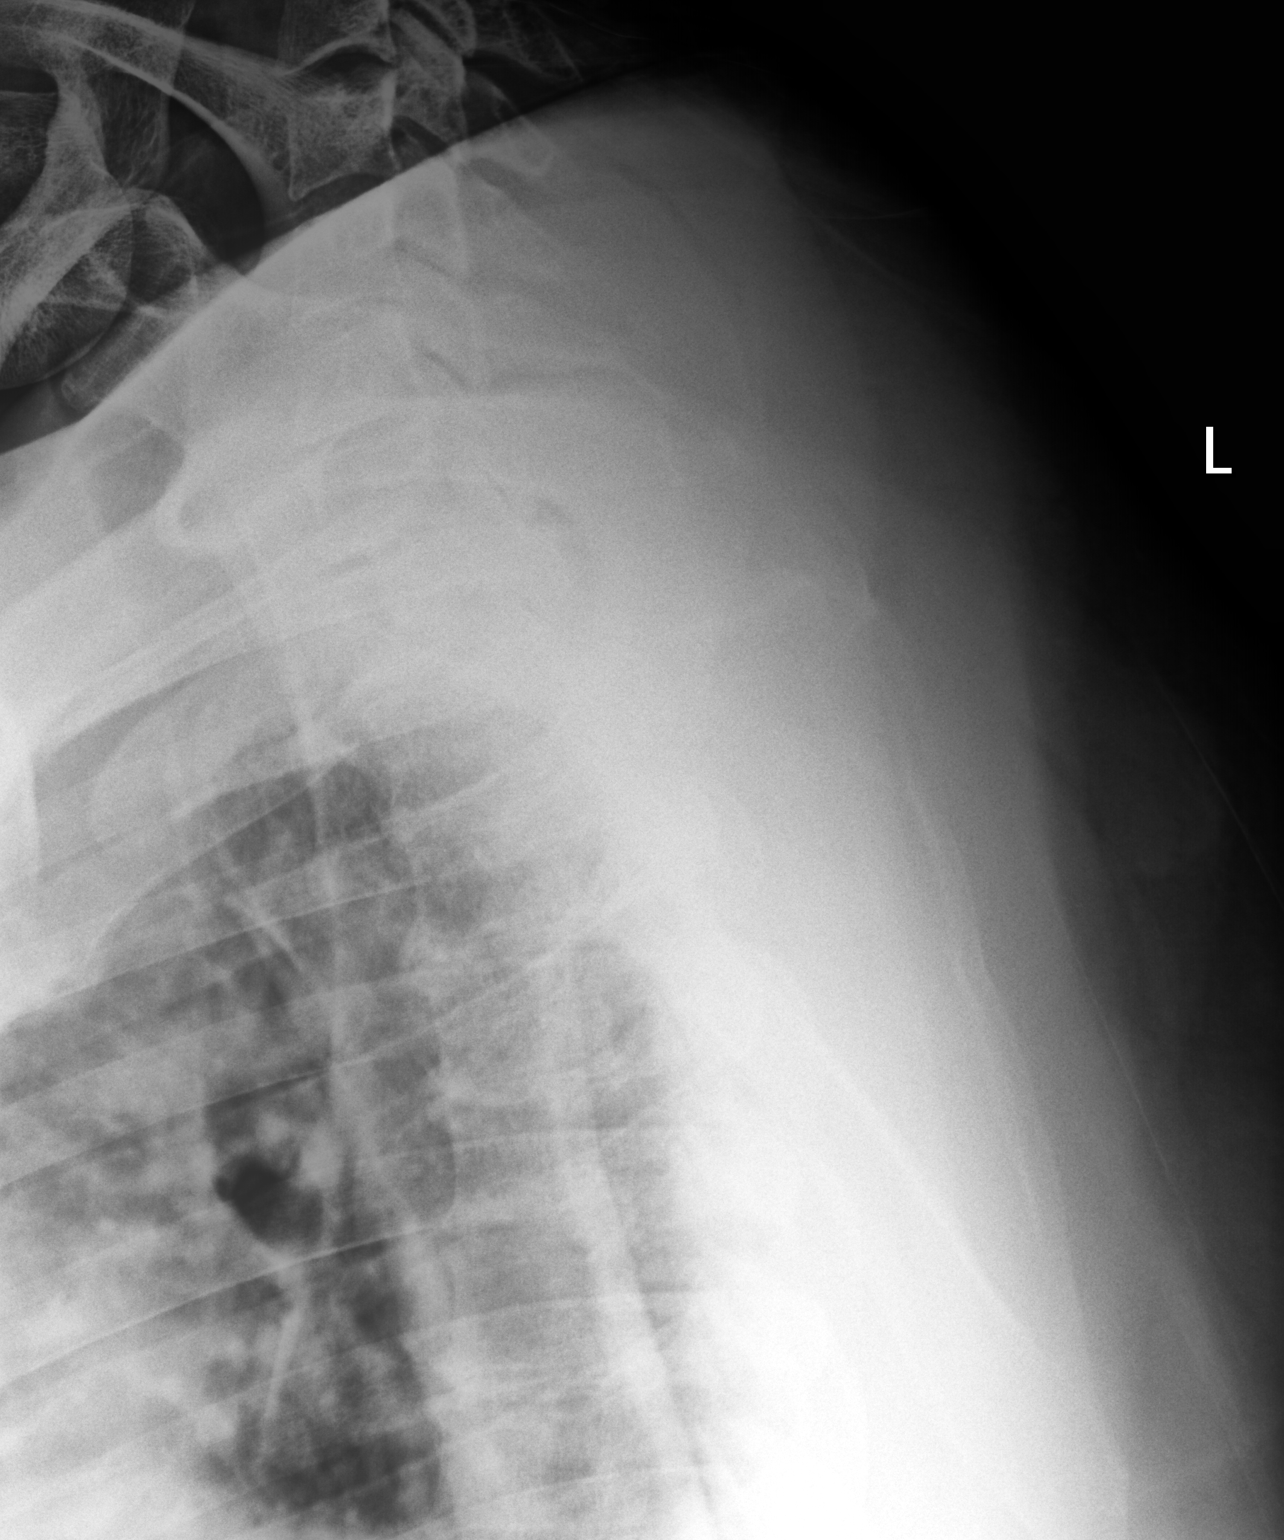

[3 of 3 positions shown; findings below may reference images not displayed]

FINDINGS: There is no evidence of thoracic spine fracture. Vertebral body
heights maintained. Alignment is normal. Minimal degenerative
endplate spurring. No other significant bone abnormalities are
identified.
IMPRESSION: Minimal degenerative changes of the thoracic spine. No acute
findings.

## 2021-08-16 IMAGING — DX DG HIP (WITH OR WITHOUT PELVIS) 3-4V BILAT
4 series · 4 of 4 positions shown · non-contrast
Comparison: None.

CLINICAL DATA: Chronic bilateral hip pain

EXAM:
DG HIP (WITH OR WITHOUT PELVIS) 3-4V BILAT

[dg hips bilat w or w/o pelvis 3-4 views (1 of 4)]
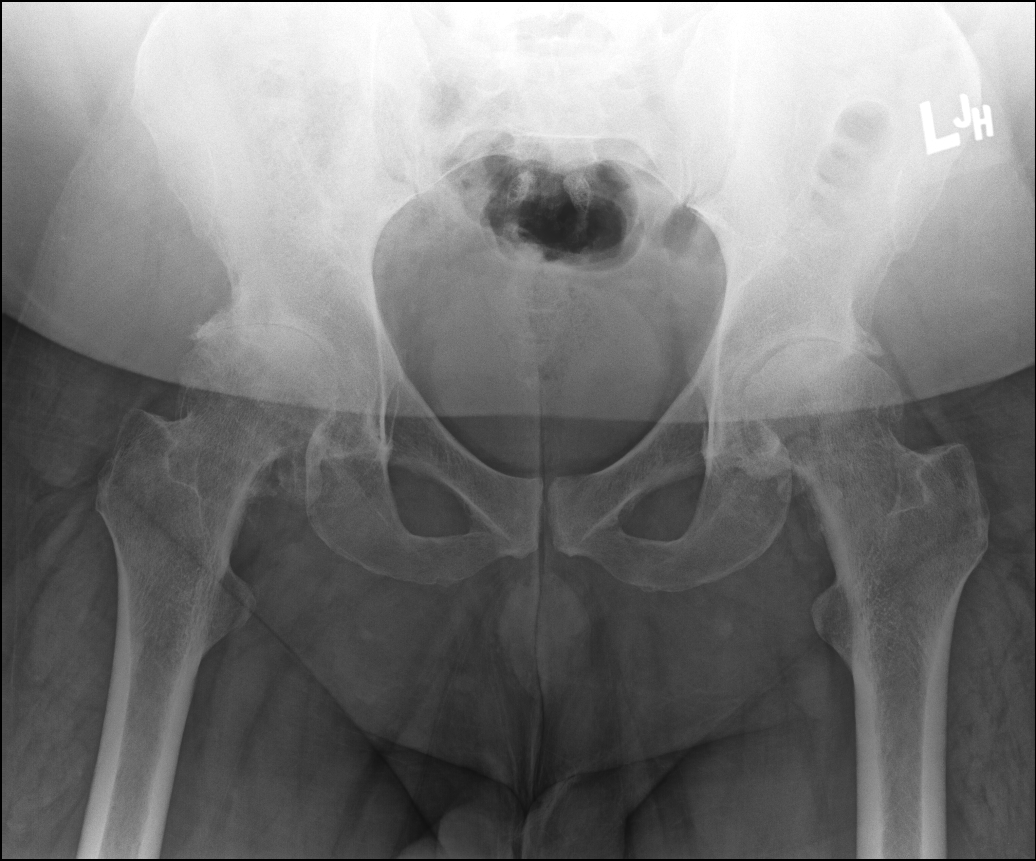

[dg hips bilat w or w/o pelvis 3-4 views (2 of 4)]
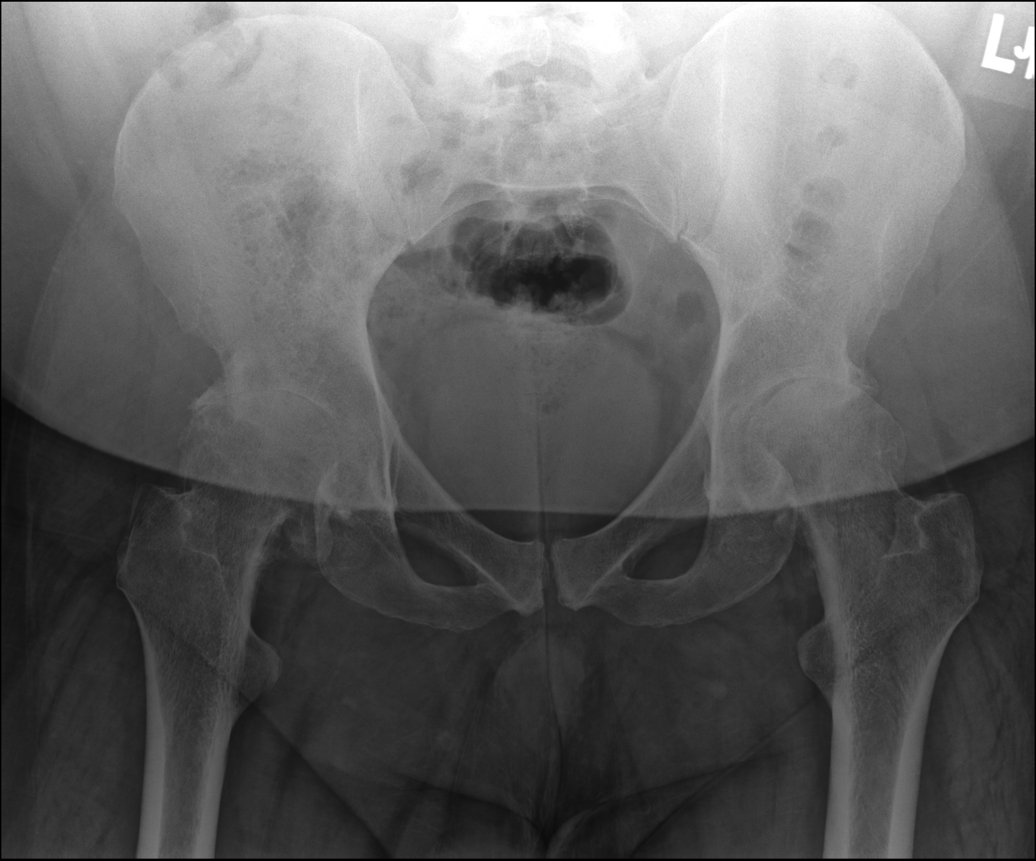

[dg hips bilat w or w/o pelvis 3-4 views (3 of 4)]
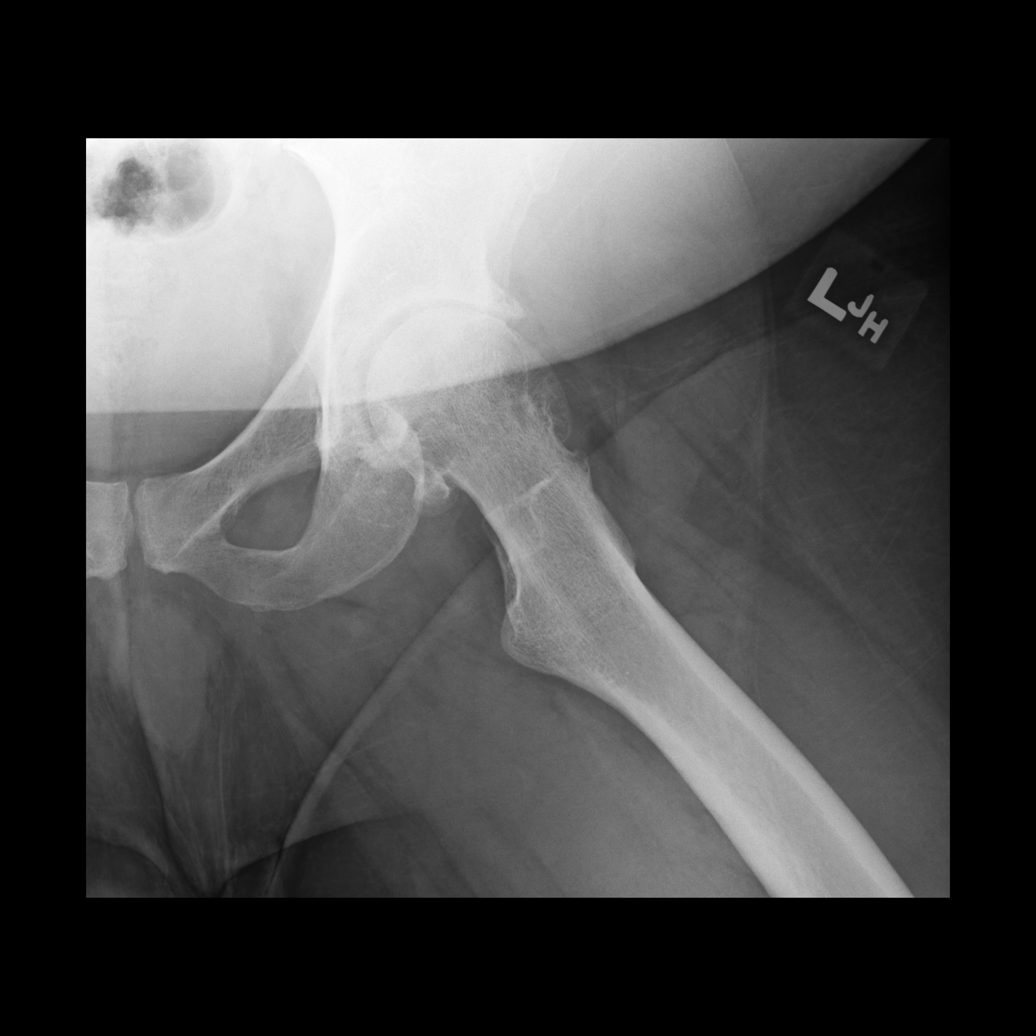

[dg hips bilat w or w/o pelvis 3-4 views (4 of 4)]
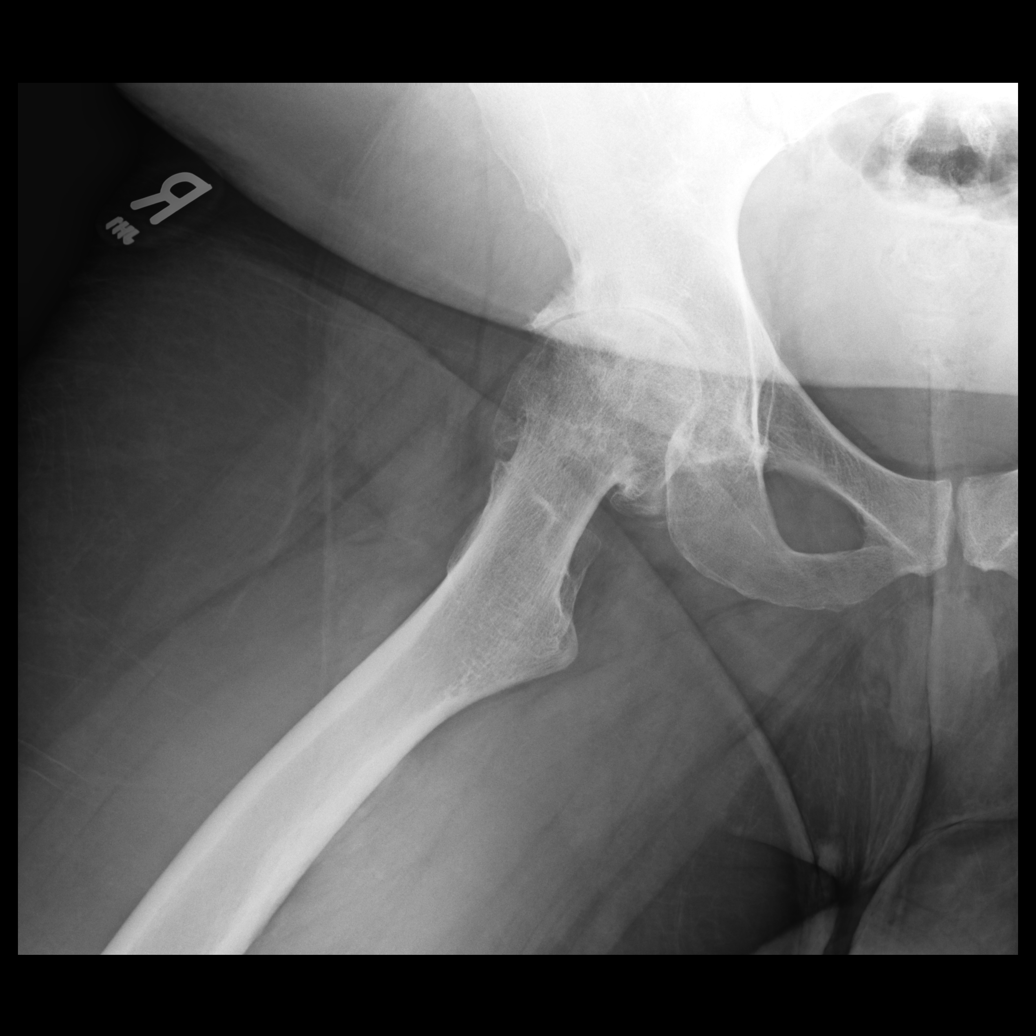

[4 of 4 positions shown; findings below may reference images not displayed]

FINDINGS: No acute fracture or dislocation. Severe degenerative changes of the
bilateral hips, right worse than left with complete joint space
loss, subchondral sclerosis/cystic change, and prominent marginal
osteophyte formation. Pubic symphysis and SI joints appear intact.
IMPRESSION: Severe degenerative changes of the bilateral hips, right worse than
left.
